# Patient Record
Sex: Female | Born: 1950
Health system: Southern US, Community
[De-identification: ages and names within clinical notes are randomized; demographics above are authoritative.]

## PROBLEM LIST (undated history)

## (undated) DIAGNOSIS — I10 Essential (primary) hypertension: Secondary | ICD-10-CM

## (undated) DIAGNOSIS — E785 Hyperlipidemia, unspecified: Secondary | ICD-10-CM

## (undated) DIAGNOSIS — R7303 Prediabetes: Secondary | ICD-10-CM

## (undated) DIAGNOSIS — F419 Anxiety disorder, unspecified: Secondary | ICD-10-CM

## (undated) HISTORY — PX: SKIN CANCER EXCISION: SHX779

## (undated) HISTORY — DX: Hyperlipidemia, unspecified: E78.5

## (undated) HISTORY — PX: ABDOMINAL HYSTERECTOMY: SHX81

## (undated) HISTORY — DX: Anxiety disorder, unspecified: F41.9

## (undated) HISTORY — DX: Essential (primary) hypertension: I10

---

## 1982-04-02 HISTORY — PX: OTHER SURGICAL HISTORY: SHX169

## 1992-04-02 HISTORY — PX: BREAST CYST ASPIRATION: SHX578

## 2004-02-09 ENCOUNTER — Ambulatory Visit: Payer: Self-pay | Admitting: Internal Medicine

## 2006-05-21 ENCOUNTER — Ambulatory Visit: Payer: Self-pay | Admitting: Internal Medicine

## 2007-07-25 ENCOUNTER — Ambulatory Visit: Payer: Self-pay | Admitting: Internal Medicine

## 2010-10-19 ENCOUNTER — Ambulatory Visit: Payer: Self-pay | Admitting: Internal Medicine

## 2011-10-23 ENCOUNTER — Ambulatory Visit: Payer: Self-pay | Admitting: Internal Medicine

## 2014-12-01 ENCOUNTER — Encounter: Payer: Self-pay | Admitting: *Deleted

## 2014-12-13 ENCOUNTER — Ambulatory Visit: Payer: Self-pay | Admitting: General Surgery

## 2014-12-20 ENCOUNTER — Ambulatory Visit (INDEPENDENT_AMBULATORY_CARE_PROVIDER_SITE_OTHER): Payer: Medicare HMO | Admitting: General Surgery

## 2014-12-20 ENCOUNTER — Encounter: Payer: Self-pay | Admitting: General Surgery

## 2014-12-20 VITALS — BP 130/72 | HR 70 | Resp 14 | Ht 61.0 in | Wt 167.0 lb

## 2014-12-20 DIAGNOSIS — Z1211 Encounter for screening for malignant neoplasm of colon: Secondary | ICD-10-CM | POA: Diagnosis not present

## 2014-12-20 MED ORDER — POLYETHYLENE GLYCOL 3350 17 GM/SCOOP PO POWD: ORAL | Status: DC

## 2014-12-20 NOTE — Progress Notes (Signed)
Patient ID: Heidi Bell, female   DOB: Jul 17, 1950, 64 y.o.   MRN: [id number]  Chief Complaint  Patient presents with  . Other    colonoscopy    HPI RAELENE TREW is a 65 y.o. female here today for a evaluation of an colonoscopy . Patient states no GI problems at this time. No prior colonoscopy.  HPI  Past Medical History  Diagnosis Date  . Hypertension   . Hyperlipidemia     Past Surgical History  Procedure Laterality Date  . Abdominal hysterectomy    . Boil removed  1984  . Breast cyst aspiration Left 1994    Dr. Bary Castilla    Family History  Problem Relation Age of Onset  . Cancer Father     colon  . COPD Mother   . Breast cancer Sister   . Lung cancer Mother     Social History Social History  Substance Use Topics  . Smoking status: Never Smoker   . Smokeless tobacco: None  . Alcohol Use: No    Allergies  Allergen Reactions  . Penicillins Hives    Current Outpatient Prescriptions  Medication Sig Dispense Refill  . Ascorbic Acid (VITAMIN C) 500 MG CAPS     . atenolol (TENORMIN) 50 MG tablet Take 1 tablet by mouth daily.    . Cholecalciferol (VITAMIN D3) 1000 UNITS CAPS Take by mouth daily.    . Garlic 2423 MG CAPS Take by mouth daily.    Marland Kitchen glucosamine-chondroitin 500-400 MG tablet Take 1 tablet by mouth 3 (three) times daily.    . hydrochlorothiazide (HYDRODIURIL) 25 MG tablet Take 25 mg by mouth daily.     . Multiple Vitamins-Minerals (MULTIVITAMIN WITH MINERALS) tablet Take 1 tablet by mouth daily.    . Omega-3 Fatty Acids (FISH OIL) 1200 MG CAPS Take by mouth daily.    . potassium chloride (K-DUR) 10 MEQ tablet Take 10 mEq by mouth 2 (two) times daily.     . Red Yeast Rice 600 MG CAPS Take by mouth.    . sertraline (ZOLOFT) 50 MG tablet Take 50 mg by mouth daily.     . polyethylene glycol powder (GLYCOLAX/MIRALAX) powder 255 grams one bottle for colonoscopy prep 255 g 0   No current facility-administered medications for this visit.    Review of  Systems Review of Systems  Constitutional: Negative.   Respiratory: Negative.   Cardiovascular: Negative.   Gastrointestinal: Negative.     Blood pressure 130/72, pulse 70, resp. rate 14, height 5\' 1"  (1.549 m), weight 167 lb (75.751 kg).  Physical Exam Physical Exam  Constitutional: She is oriented to person, place, and time. She appears well-developed and well-nourished.  Eyes: Conjunctivae are normal. No scleral icterus.  Neck: Neck supple.  Cardiovascular: Normal rate, regular rhythm and normal heart sounds.   Pulmonary/Chest: Effort normal and breath sounds normal.  Abdominal: Soft. Bowel sounds are normal. There is no tenderness.  Lymphadenopathy:    She has no cervical adenopathy.  Neurological: She is alert and oriented to person, place, and time.  Skin: Skin is warm and dry.    Data Reviewed Notes and labs reviewed  Assessment    Normal exam. Pt agreeable to screening colonoscopy   Plan    Colonoscopy with possible biopsy/polypectomy prn: Information regarding the procedure, including its potential risks and complications (including but not limited to perforation of the bowel, which may require emergency surgery to repair, and bleeding) was verbally given to the patient. Educational information regarding  lower intestinal endoscopy was given to the patient. Written instructions for how to complete the bowel prep using Miralax were provided. The importance of drinking ample fluids to avoid dehydration as a result of the prep emphasized.    Patient has been scheduled for a colonoscopy on 01-12-15 at [facility]. This patient has been asked to discontinue fish oil one week prior to procedure.   PCP:  Trudie Reed 12/22/2014, 8:23 AM

## 2014-12-20 NOTE — Patient Instructions (Addendum)
Colonoscopy A colonoscopy is an exam to look at the entire large intestine (colon). This exam can help find problems such as tumors, polyps, inflammation, and areas of bleeding. The exam takes about 1 hour.  LET [facility] CARE PROVIDER KNOW ABOUT:   Any allergies you have.  All medicines you are taking, including vitamins, herbs, eye drops, creams, and over-the-counter medicines.  Previous problems you or members of your family have had with the use of anesthetics.  Any blood disorders you have.  Previous surgeries you have had.  Medical conditions you have. RISKS AND COMPLICATIONS  Generally, this is a safe procedure. However, as with any procedure, complications can occur. Possible complications include:  Bleeding.  Tearing or rupture of the colon wall.  Reaction to medicines given during the exam.  Infection (rare). BEFORE THE PROCEDURE   Ask your health care provider about changing or stopping your regular medicines.  You may be prescribed an oral bowel prep. This involves drinking a large amount of medicated liquid, starting the day before your procedure. The liquid will cause you to have multiple loose stools until your stool is almost clear or light green. This cleans out your colon in preparation for the procedure.  Do not eat or drink anything else once you have started the bowel prep, unless your health care provider tells you it is safe to do so.  Arrange for someone to drive you home after the procedure. PROCEDURE   You will be given medicine to help you relax (sedative).  You will lie on your side with your knees bent.  A long, flexible tube with a light and camera on the end (colonoscope) will be inserted through the rectum and into the colon. The camera sends video back to a computer screen as it moves through the colon. The colonoscope also releases carbon dioxide gas to inflate the colon. This helps your health care provider see the area better.  During  the exam, your health care provider may take a small tissue sample (biopsy) to be examined under a microscope if any abnormalities are found.  The exam is finished when the entire colon has been viewed. AFTER THE PROCEDURE   Do not drive for 24 hours after the exam.  You may have a small amount of blood in your stool.  You may pass moderate amounts of gas and have mild abdominal cramping or bloating. This is caused by the gas used to inflate your colon during the exam.  Ask when your test results will be ready and how you will get your results. Make sure you get your test results. Document Released: 03/16/2000 Document Revised: 01/07/2013 Document Reviewed: 11/24/2012 [facility] Patient Information 2015 [place], [place]. This information is not intended to replace advice given to you by your health care provider. Make sure you discuss any questions you have with your health care provider.  Patient has been scheduled for a colonoscopy on 01-12-15 at [facility]. This patient has been asked to discontinue fish oil one week prior to procedure.

## 2014-12-22 ENCOUNTER — Encounter: Payer: Self-pay | Admitting: General Surgery

## 2015-01-11 ENCOUNTER — Encounter: Payer: Self-pay | Admitting: *Deleted

## 2015-01-11 ENCOUNTER — Other Ambulatory Visit: Payer: Self-pay | Admitting: General Surgery

## 2015-01-12 ENCOUNTER — Encounter
Admission: RE | Disposition: A | Discharge status: Home / Self Care | Payer: Self-pay | Source: Ambulatory Visit | Attending: General Surgery

## 2015-01-12 ENCOUNTER — Ambulatory Visit
Admission: RE | Admit: 2015-01-12 | Discharge: 2015-01-12 | Disposition: A | Discharge status: Home / Self Care | Payer: Medicare HMO | Source: Ambulatory Visit | Attending: General Surgery | Admitting: General Surgery

## 2015-01-12 ENCOUNTER — Ambulatory Visit: Payer: Medicare HMO | Admitting: Anesthesiology

## 2015-01-12 ENCOUNTER — Encounter: Payer: Self-pay | Admitting: *Deleted

## 2015-01-12 DIAGNOSIS — I10 Essential (primary) hypertension: Secondary | ICD-10-CM | POA: Insufficient documentation

## 2015-01-12 DIAGNOSIS — Z79899 Other long term (current) drug therapy: Secondary | ICD-10-CM | POA: Diagnosis not present

## 2015-01-12 DIAGNOSIS — D123 Benign neoplasm of transverse colon: Secondary | ICD-10-CM | POA: Insufficient documentation

## 2015-01-12 DIAGNOSIS — Z1211 Encounter for screening for malignant neoplasm of colon: Secondary | ICD-10-CM | POA: Insufficient documentation

## 2015-01-12 DIAGNOSIS — K5730 Diverticulosis of large intestine without perforation or abscess without bleeding: Secondary | ICD-10-CM | POA: Insufficient documentation

## 2015-01-12 DIAGNOSIS — Z803 Family history of malignant neoplasm of breast: Secondary | ICD-10-CM | POA: Insufficient documentation

## 2015-01-12 DIAGNOSIS — Z800 Family history of malignant neoplasm of digestive organs: Secondary | ICD-10-CM | POA: Insufficient documentation

## 2015-01-12 DIAGNOSIS — Z801 Family history of malignant neoplasm of trachea, bronchus and lung: Secondary | ICD-10-CM | POA: Insufficient documentation

## 2015-01-12 DIAGNOSIS — Z880 Allergy status to penicillin: Secondary | ICD-10-CM | POA: Insufficient documentation

## 2015-01-12 DIAGNOSIS — Z90710 Acquired absence of both cervix and uterus: Secondary | ICD-10-CM | POA: Diagnosis not present

## 2015-01-12 DIAGNOSIS — D120 Benign neoplasm of cecum: Secondary | ICD-10-CM | POA: Insufficient documentation

## 2015-01-12 DIAGNOSIS — E785 Hyperlipidemia, unspecified: Secondary | ICD-10-CM | POA: Insufficient documentation

## 2015-01-12 DIAGNOSIS — Z825 Family history of asthma and other chronic lower respiratory diseases: Secondary | ICD-10-CM | POA: Diagnosis not present

## 2015-01-12 HISTORY — PX: COLONOSCOPY WITH PROPOFOL: SHX5780

## 2015-01-12 SURGERY — COLONOSCOPY WITH PROPOFOL
Anesthesia: General

## 2015-01-12 MED ORDER — PROPOFOL 500 MG/50ML IV EMUL
INTRAVENOUS | Status: DC | PRN
  Administered 2015-01-12: 125 ug/kg/min via INTRAVENOUS

## 2015-01-12 MED ORDER — SODIUM CHLORIDE 0.9 % IV SOLN
INTRAVENOUS | Status: DC
  Administered 2015-01-12: 08:00:00 via INTRAVENOUS

## 2015-01-12 MED ORDER — PROPOFOL 10 MG/ML IV BOLUS
INTRAVENOUS | Status: DC | PRN
  Administered 2015-01-12: 40 mg via INTRAVENOUS
  Administered 2015-01-12: 60 mg via INTRAVENOUS

## 2015-01-12 MED ORDER — LIDOCAINE HCL (CARDIAC) 20 MG/ML IV SOLN
INTRAVENOUS | Status: DC | PRN
  Administered 2015-01-12: 60 mg via INTRAVENOUS

## 2015-01-12 NOTE — H&P (View-Only) (Signed)
Patient ID: Heidi Bell, female   DOB: 17-Apr-1950, 64 y.o.   MRN: [id number]  Chief Complaint  Patient presents with  . Other    colonoscopy    HPI Heidi Bell is a 64 y.o. female here today for a evaluation of an colonoscopy . Patient states no GI problems at this time. No prior colonoscopy.  HPI  Past Medical History  Diagnosis Date  . Hypertension   . Hyperlipidemia     Past Surgical History  Procedure Laterality Date  . Abdominal hysterectomy    . Boil removed  1984  . Breast cyst aspiration Left 1994    Dr. Bary Castilla    Family History  Problem Relation Age of Onset  . Cancer Father     colon  . COPD Mother   . Breast cancer Sister   . Lung cancer Mother     Social History Social History  Substance Use Topics  . Smoking status: Never Smoker   . Smokeless tobacco: None  . Alcohol Use: No    Allergies  Allergen Reactions  . Penicillins Hives    Current Outpatient Prescriptions  Medication Sig Dispense Refill  . Ascorbic Acid (VITAMIN C) 500 MG CAPS     . atenolol (TENORMIN) 50 MG tablet Take 1 tablet by mouth daily.    . Cholecalciferol (VITAMIN D3) 1000 UNITS CAPS Take by mouth daily.    . Garlic 0865 MG CAPS Take by mouth daily.    Marland Kitchen glucosamine-chondroitin 500-400 MG tablet Take 1 tablet by mouth 3 (three) times daily.    . hydrochlorothiazide (HYDRODIURIL) 25 MG tablet Take 25 mg by mouth daily.     . Multiple Vitamins-Minerals (MULTIVITAMIN WITH MINERALS) tablet Take 1 tablet by mouth daily.    . Omega-3 Fatty Acids (FISH OIL) 1200 MG CAPS Take by mouth daily.    . potassium chloride (K-DUR) 10 MEQ tablet Take 10 mEq by mouth 2 (two) times daily.     . Red Yeast Rice 600 MG CAPS Take by mouth.    . sertraline (ZOLOFT) 50 MG tablet Take 50 mg by mouth daily.     . polyethylene glycol powder (GLYCOLAX/MIRALAX) powder 255 grams one bottle for colonoscopy prep 255 g 0   No current facility-administered medications for this visit.    Review of  Systems Review of Systems  Constitutional: Negative.   Respiratory: Negative.   Cardiovascular: Negative.   Gastrointestinal: Negative.     Blood pressure 130/72, pulse 70, resp. rate 14, height 5\' 1"  (1.549 m), weight 167 lb (75.751 kg).  Physical Exam Physical Exam  Constitutional: She is oriented to person, place, and time. She appears well-developed and well-nourished.  Eyes: Conjunctivae are normal. No scleral icterus.  Neck: Neck supple.  Cardiovascular: Normal rate, regular rhythm and normal heart sounds.   Pulmonary/Chest: Effort normal and breath sounds normal.  Abdominal: Soft. Bowel sounds are normal. There is no tenderness.  Lymphadenopathy:    She has no cervical adenopathy.  Neurological: She is alert and oriented to person, place, and time.  Skin: Skin is warm and dry.    Data Reviewed Notes and labs reviewed  Assessment    Normal exam. Pt agreeable to screening colonoscopy   Plan    Colonoscopy with possible biopsy/polypectomy prn: Information regarding the procedure, including its potential risks and complications (including but not limited to perforation of the bowel, which may require emergency surgery to repair, and bleeding) was verbally given to the patient. Educational information regarding  lower intestinal endoscopy was given to the patient. Written instructions for how to complete the bowel prep using Miralax were provided. The importance of drinking ample fluids to avoid dehydration as a result of the prep emphasized.    Patient has been scheduled for a colonoscopy on 01-12-15 at [facility]. This patient has been asked to discontinue fish oil one week prior to procedure.   PCP:  Trudie Reed 12/22/2014, 8:23 AM

## 2015-01-12 NOTE — Anesthesia Preprocedure Evaluation (Signed)
Anesthesia Evaluation  Patient identified by MRN, date of birth, ID band Patient awake    Reviewed: Allergy & Precautions, H&P , NPO status , Patient's Chart, lab work & pertinent test results, reviewed documented beta blocker date and time   Airway Mallampati: II  TM Distance: >3 FB Neck ROM: full    Dental no notable dental hx. (+) Teeth Intact   Pulmonary neg pulmonary ROS,    Pulmonary exam normal breath sounds clear to auscultation       Cardiovascular Exercise Tolerance: Good hypertension, negative cardio ROS Normal cardiovascular exam Rhythm:regular Rate:Normal     Neuro/Psych negative neurological ROS  negative psych ROS   GI/Hepatic negative GI ROS, Neg liver ROS,   Endo/Other  negative endocrine ROS  Renal/GU negative Renal ROS  negative genitourinary   Musculoskeletal   Abdominal   Peds  Hematology negative hematology ROS (+)   Anesthesia Other Findings   Reproductive/Obstetrics negative OB ROS                             Anesthesia Physical Anesthesia Plan  ASA: II  Anesthesia Plan: General   Post-op Pain Management:    Induction:   Airway Management Planned:   Additional Equipment:   Intra-op Plan:   Post-operative Plan:   Informed Consent: I have reviewed the patients History and Physical, chart, labs and discussed the procedure including the risks, benefits and alternatives for the proposed anesthesia with the patient or authorized representative who has indicated his/her understanding and acceptance.   Dental Advisory Given  Plan Discussed with: CRNA  Anesthesia Plan Comments:         Anesthesia Quick Evaluation

## 2015-01-12 NOTE — Interval H&P Note (Signed)
History and Physical Interval Note:  01/12/2015 7:52 AM  Heidi Bell  has presented today for surgery, with the diagnosis of SCREENING  The various methods of treatment have been discussed with the patient and family. After consideration of risks, benefits and other options for treatment, the patient has consented to  Procedure(s): COLONOSCOPY WITH PROPOFOL (N/A) as a surgical intervention .  The patient's history has been reviewed, patient examined, no change in status, stable for surgery.  I have reviewed the patient's chart and labs.  Questions were answered to the patient's satisfaction.     SANKAR,SEEPLAPUTHUR G

## 2015-01-12 NOTE — Transfer of Care (Signed)
Immediate Anesthesia Transfer of Care Note  Patient: Heidi Bell  Procedure(s) Performed: Procedure(s): COLONOSCOPY WITH PROPOFOL (N/A)  Patient Location: PACU  Anesthesia Type:General  Level of Consciousness: awake, alert  and oriented  Airway & Oxygen Therapy: Patient Spontanous Breathing and Patient connected to nasal cannula oxygen  Post-op Assessment: Report given to RN and Post -op Vital signs reviewed and stable  Post vital signs: stable  Last Vitals:  Filed Vitals:   01/12/15 0847  BP: 117/60  Pulse: 63  Temp: 36.1 C  Resp: 17    Complications: No apparent anesthesia complications

## 2015-01-12 NOTE — Op Note (Signed)
[facility] Gastroenterology Patient Name: Heidi Bell Procedure Date: 01/12/2015 8:07 AM MRN: [id number] Account #: [id number] Date of Birth: 1950/11/26 Admit Type: Outpatient Age: 64 Room: [facility] ENDO ROOM 1 Gender: Female Note Status: Finalized Procedure:         Colonoscopy Indications:       Screening for colorectal malignant neoplasm Providers:         Orlie Pollen, MD Referring MD:      Leona Carry. Hall Busing, MD (Referring MD) Medicines:         General Anesthesia Complications:     No immediate complications. Procedure:         Pre-Anesthesia Assessment:                    - General anesthesia under the supervision of an                     anesthesiologist was determined to be medically necessary                     for this procedure based on review of the patient's                     medical history, medications, and prior anesthesia history.                    After obtaining informed consent, the colonoscope was                     passed under direct vision. Throughout the procedure, the                     patient's blood pressure, pulse, and oxygen saturations                     were monitored continuously. The Colonoscope was                     introduced through the anus and advanced to the the cecum,                     identified by the ileocecal valve. The colonoscopy was                     performed without difficulty. The patient tolerated the                     procedure well. The quality of the bowel preparation was                     excellent. Findings:      The perianal and digital rectal examinations were normal.      A few small and large-mouthed diverticula were found in the sigmoid       colon.      A 5 mm polyp was found in the transverse colon. The polyp was sessile.       The polyp was removed with a hot biopsy forceps. Resection and retrieval       were complete.      A 5 mm polyp was found in the cecum. The polyp was  sessile. The polyp       was removed with a hot snare. Resection and retrieval were complete.       Estimated blood loss was minimal.  The exam was otherwise without abnormality on direct and retroflexion       views. Impression:        - Diverticulosis in the sigmoid colon.                    - One 5 mm polyp in the transverse colon. Resected and                     retrieved.                    - One 5 mm polyp in the cecum. Resected and retrieved.                    - The examination was otherwise normal on direct and                     retroflexion views. Recommendation:    - Use fiber, for example Citrucel, Fibercon, Konsyl or                     Metamucil.                    - Repeat colonoscopy in 5 years for surveillance. Procedure Code(s): --- Professional ---                    [phone], Colonoscopy, flexible; with removal of tumor(s),                     polyp(s), or other lesion(s) by snare technique                    45384, 59, Colonoscopy, flexible; with removal of                     tumor(s), polyp(s), or other lesion(s) by hot biopsy                     forceps Diagnosis Code(s): --- Professional ---                    Z12.11, Encounter for screening for malignant neoplasm of                     colon                    D12.3, Benign neoplasm of transverse colon                    D12.0, Benign neoplasm of cecum                    K57.30, Diverticulosis of large intestine without                     perforation or abscess without bleeding CPT copyright 2014 American Medical Association. All rights reserved. The codes documented in this report are preliminary and upon coder review may  be revised to meet current compliance requirements. Orlie Pollen, MD 01/12/2015 8:47:18 AM This report has been signed electronically. Number of Addenda: 0 Note Initiated On: 01/12/2015 8:07 AM Scope Withdrawal Time: 0 hours 9 minutes 56 seconds  Total Procedure Duration:  0 hours 23 minutes 52 seconds       [facility]

## 2015-01-13 LAB — SURGICAL PATHOLOGY

## 2015-01-13 NOTE — Anesthesia Postprocedure Evaluation (Signed)
  Anesthesia Post-op Note  Patient: Heidi Bell  Procedure(s) Performed: Procedure(s): COLONOSCOPY WITH PROPOFOL (N/A)  Anesthesia type:General  Patient location: PACU  Post pain: Pain level controlled  Post assessment: Post-op Vital signs reviewed, Patient's Cardiovascular Status Stable, Respiratory Function Stable, Patent Airway and No signs of Nausea or vomiting  Post vital signs: Reviewed and stable  Last Vitals:  Filed Vitals:   01/12/15 0910  BP: 155/92  Pulse: 73  Temp:   Resp: 19    Level of consciousness: awake, alert  and patient cooperative  Complications: No apparent anesthesia complications

## 2015-01-18 ENCOUNTER — Telehealth: Payer: Self-pay | Admitting: *Deleted

## 2015-01-18 NOTE — Telephone Encounter (Signed)
-----   Message from Christene Lye, MD sent at 01/13/2015  6:07 PM EDT ----- Please let pt pt know the pathology was normal. Needs colonoscopy in 5 yrs. Copy to PCP

## 2015-01-18 NOTE — Telephone Encounter (Signed)
Notified patient as instructed, patient pleased. Discussed follow-up appointments, patient agrees. Placed in recalls.  

## 2015-01-19 ENCOUNTER — Encounter: Payer: Self-pay | Admitting: General Surgery

## 2015-06-01 DIAGNOSIS — I10 Essential (primary) hypertension: Secondary | ICD-10-CM | POA: Diagnosis not present

## 2015-06-01 DIAGNOSIS — E785 Hyperlipidemia, unspecified: Secondary | ICD-10-CM | POA: Diagnosis not present

## 2015-06-08 DIAGNOSIS — E785 Hyperlipidemia, unspecified: Secondary | ICD-10-CM | POA: Diagnosis not present

## 2015-06-08 DIAGNOSIS — F330 Major depressive disorder, recurrent, mild: Secondary | ICD-10-CM | POA: Diagnosis not present

## 2015-06-08 DIAGNOSIS — I10 Essential (primary) hypertension: Secondary | ICD-10-CM | POA: Diagnosis not present

## 2015-11-30 DIAGNOSIS — I10 Essential (primary) hypertension: Secondary | ICD-10-CM | POA: Diagnosis not present

## 2015-12-09 DIAGNOSIS — I10 Essential (primary) hypertension: Secondary | ICD-10-CM | POA: Diagnosis not present

## 2015-12-09 DIAGNOSIS — E785 Hyperlipidemia, unspecified: Secondary | ICD-10-CM | POA: Diagnosis not present

## 2015-12-23 DIAGNOSIS — I10 Essential (primary) hypertension: Secondary | ICD-10-CM | POA: Diagnosis not present

## 2015-12-23 DIAGNOSIS — E785 Hyperlipidemia, unspecified: Secondary | ICD-10-CM | POA: Diagnosis not present

## 2016-01-23 DIAGNOSIS — L719 Rosacea, unspecified: Secondary | ICD-10-CM | POA: Diagnosis not present

## 2016-02-21 DIAGNOSIS — I10 Essential (primary) hypertension: Secondary | ICD-10-CM | POA: Diagnosis not present

## 2016-03-29 DIAGNOSIS — I10 Essential (primary) hypertension: Secondary | ICD-10-CM | POA: Diagnosis not present

## 2016-06-22 DIAGNOSIS — I10 Essential (primary) hypertension: Secondary | ICD-10-CM | POA: Diagnosis not present

## 2016-07-16 DIAGNOSIS — E785 Hyperlipidemia, unspecified: Secondary | ICD-10-CM | POA: Diagnosis not present

## 2016-07-16 DIAGNOSIS — I10 Essential (primary) hypertension: Secondary | ICD-10-CM | POA: Diagnosis not present

## 2016-09-18 DIAGNOSIS — E785 Hyperlipidemia, unspecified: Secondary | ICD-10-CM | POA: Diagnosis not present

## 2016-09-18 DIAGNOSIS — I10 Essential (primary) hypertension: Secondary | ICD-10-CM | POA: Diagnosis not present

## 2016-12-07 DIAGNOSIS — E785 Hyperlipidemia, unspecified: Secondary | ICD-10-CM | POA: Diagnosis not present

## 2016-12-07 DIAGNOSIS — I10 Essential (primary) hypertension: Secondary | ICD-10-CM | POA: Diagnosis not present

## 2016-12-13 DIAGNOSIS — I10 Essential (primary) hypertension: Secondary | ICD-10-CM | POA: Diagnosis not present

## 2016-12-13 DIAGNOSIS — E785 Hyperlipidemia, unspecified: Secondary | ICD-10-CM | POA: Diagnosis not present

## 2017-02-26 ENCOUNTER — Ambulatory Visit (INDEPENDENT_AMBULATORY_CARE_PROVIDER_SITE_OTHER): Payer: PPO | Admitting: Nurse Practitioner

## 2017-02-26 ENCOUNTER — Other Ambulatory Visit: Payer: Self-pay

## 2017-02-26 ENCOUNTER — Encounter: Payer: Self-pay | Admitting: Nurse Practitioner

## 2017-02-26 ENCOUNTER — Other Ambulatory Visit: Payer: Self-pay | Admitting: Nurse Practitioner

## 2017-02-26 VITALS — BP 150/56 | HR 55 | Temp 98.0°F | Ht 61.0 in | Wt 146.8 lb

## 2017-02-26 DIAGNOSIS — I10 Essential (primary) hypertension: Secondary | ICD-10-CM | POA: Diagnosis not present

## 2017-02-26 DIAGNOSIS — Z7689 Persons encountering health services in other specified circumstances: Secondary | ICD-10-CM | POA: Diagnosis not present

## 2017-02-26 DIAGNOSIS — E782 Mixed hyperlipidemia: Secondary | ICD-10-CM | POA: Diagnosis not present

## 2017-02-26 DIAGNOSIS — F419 Anxiety disorder, unspecified: Secondary | ICD-10-CM

## 2017-02-26 DIAGNOSIS — E785 Hyperlipidemia, unspecified: Secondary | ICD-10-CM | POA: Insufficient documentation

## 2017-02-26 MED ORDER — POTASSIUM CHLORIDE CRYS ER 10 MEQ PO TBCR: 10.0000 meq | EXTENDED_RELEASE_TABLET | Freq: Every day | ORAL | 1 refills | Status: DC

## 2017-02-26 MED ORDER — VITAMIN C 500 MG PO CAPS: 500.0000 mg | ORAL_CAPSULE | Freq: Every day | ORAL | Status: AC

## 2017-02-26 MED ORDER — RED YEAST RICE 600 MG PO CAPS: 1800.0000 mg | ORAL_CAPSULE | Freq: Every day | ORAL | Status: DC

## 2017-02-26 MED ORDER — FISH OIL 1200 MG PO CAPS: 1200.0000 mg | ORAL_CAPSULE | Freq: Two times a day (BID) | ORAL | Status: DC

## 2017-02-26 MED ORDER — SERTRALINE HCL 25 MG PO TABS: 25.0000 mg | ORAL_TABLET | Freq: Every day | ORAL | 1 refills | Status: DC

## 2017-02-26 MED ORDER — GLUCOSAMINE-CHONDROITIN 500-400 MG PO TABS: 1.0000 | ORAL_TABLET | Freq: Two times a day (BID) | ORAL | Status: AC

## 2017-02-26 NOTE — Assessment & Plan Note (Signed)
Uncontrolled hypertension in clinic today.  Pt reports home BP readings that are consistently at goal.  Pt is working on lifestyle modifications.  Taking medications tolerating well without side effects. No current complications.  Plan: 1. Continue taking atenolol, losartan, hydrochlorothiazide. 2. Obtain labs CMP and lipid panel prior to next visit.  3. Encouraged heart healthy diet and increasing exercise to 30 minutes most days of the week. 4. Check BP 1-2 x per week at home, keep log, and bring to clinic at next appointment. 5. Follow up 3-4 months.

## 2017-02-26 NOTE — Assessment & Plan Note (Signed)
Controlled on last lab checked by prior PCP.  Pt is trying to watch and limit fat in foods.  Not currently on a statin, but is taking red yeast rice 1800 mg daily and 2400 mg daily of fish oil.  Plan: 1. Continue current supplement treatment without changes. 2. Recheck lipid panel fasting prior to next appointment in 3 months. Consider changing therapy if not at goal levels. 3. Followup 3 months.

## 2017-02-26 NOTE — Patient Instructions (Addendum)
Heidi Bell, Thank you for coming in to clinic today.  1. For your blood pressure: - It is a bit high today, but this may be from being nervous about your new appointment. - Keep checking at home.  Your goal is less than 130/80.   - Keep a log of your readings. - RECHECK BP in clinic in about 6 weeks.  Bring your machine with you so we can compare.   2. For cholesterol: -  Continue your current supplements.  - Continue watching your fat in your diet.  Please schedule a follow-up appointment with [name], AGNP. Return for blood pressure and cholesterol with labs 2-3 days prior AND blood pressure check w/ CMA in 6 weeks.  If you have any other questions or concerns, please feel free to call the clinic or send a message through [facility]. You may also schedule an earlier appointment if necessary.  You will receive a survey after today's visit either digitally by e-mail or paper by [employer]. Your experiences and feedback matter to [place].  Please respond so we know how we are doing as we provide care for you.   [name], DNP, AGNP-BC Adult Gerontology Nurse Practitioner [place]

## 2017-02-26 NOTE — Assessment & Plan Note (Signed)
Currently controlled. Pt not currently on medication, but is showing rapid pressured speech.  Admits this is her normal and is comfortable w/ current symptoms. Not taking sertraline daily.  Plan: 1. Encouraged pt to take sertraline 25 mg once daily for best effect needs 6-8 weeks consistent dosing. 2. Followup as needed and at least once annually.

## 2017-02-26 NOTE — Progress Notes (Signed)
Subjective:    Patient ID: Heidi Bell, female    DOB: 04/01/1951, 66 y.o.   MRN: [id number]  Heidi Bell is a 66 y.o. female presenting on 02/26/2017 for [place] (hypertension )   HPI Establish Care New Provider Pt last seen by PCP Dr. Hall Busing 3 months ago.  Records received and reviewed today.    Hypertension  - She is checking BP at home or outside of clinic.  Readings 133/60 today. - Current medications: losartan 100 mg once daily, hydrocholorothiazide 25 mg once daily, atenolol 50 mg once daily , tolerating well without side effects - She is not currently symptomatic. - Pt denies headache, lightheadedness, dizziness, changes in vision, chest tightness/pressure, palpitations, leg swelling, sudden loss of speech or loss of consciousness. - She  reports an exercise routine that includes walking at the rec, 4-5 days per week. - Her diet is low in salt, low in fat, and moderate in carbohydrates.  Depression and Anxiety Takes only for a couple of days when she had "moody crying."  None are severe symptoms, but has some bad days mixed in w/ mostly good days.  HEALTH MAINTEANCE: [place] and pap. Declines pneumonia vaccine. Flu vaccine administered 2018 at [facility] for volunteer.  Past Medical History:  Diagnosis Date  . Anxiety   . Hyperlipidemia   . Hypertension    Past Surgical History:  Procedure Laterality Date  . ABDOMINAL HYSTERECTOMY    . boil removed  1984  . BREAST CYST ASPIRATION Left 1994   Dr. Bary Castilla  . COLONOSCOPY WITH PROPOFOL N/A 01/12/2015   Procedure: COLONOSCOPY WITH PROPOFOL;  Surgeon: Christene Lye, MD;  Location: [facility] ENDOSCOPY;  Service: Endoscopy;  Laterality: N/A;   Social History   Socioeconomic History  . Marital status: Single    Spouse name: Not on file  . Number of children: Not on file  . Years of education: Not on file  . Highest education level: Not on file  Social Needs  . Financial resource strain:  Not on file  . Food insecurity - worry: Not on file  . Food insecurity - inability: Not on file  . Transportation needs - medical: Not on file  . Transportation needs - non-medical: Not on file  Occupational History  . Not on file  Tobacco Use  . Smoking status: Never Smoker  . Smokeless tobacco: Never Used  Substance and Sexual Activity  . Alcohol use: No    Alcohol/week: 0.0 oz  . Drug use: No  . Sexual activity: Not on file  Other Topics Concern  . Not on file  Social History Narrative  . Not on file   Family History  Problem Relation Age of Onset  . Colon cancer Father   . COPD Mother   . Lung cancer Mother   . Bleeding Disorder Mother   . Breast cancer Sister    Current Outpatient Medications on File Prior to Visit  Medication Sig  . atenolol (TENORMIN) 50 MG tablet Take 1 tablet by mouth daily.  . Cholecalciferol (VITAMIN D3) 1000 UNITS CAPS Take by mouth daily.  . Garlic 6295 MG CAPS Take by mouth daily.  . hydrochlorothiazide (HYDRODIURIL) 25 MG tablet Take 25 mg by mouth daily.   Mar[place] Kitchen losartan (COZAAR) 100 MG tablet TK 1 T PO  QD  . Multiple Vitamins-Minerals (MULTIVITAMIN WITH MINERALS) tablet Take 1 tablet by mouth daily.   No current facility-administered medications on file prior to visit.  Review of Systems  Constitutional: Negative.   HENT: Negative.   Eyes: Negative.   Respiratory: Negative.   Cardiovascular: Negative.   Gastrointestinal: Negative.   Endocrine: Negative.   Genitourinary: Negative.   Musculoskeletal: Positive for arthralgias (hands, r leg).  Skin: Negative.   Allergic/Immunologic: Positive for environmental allergies.  Neurological: Negative.   Hematological: Negative.   Psychiatric/Behavioral: The patient is nervous/anxious.    Per HPI unless specifically indicated above     Objective:    BP (!) 150/56 (BP Location: Left Arm, Patient Position: Sitting, Cuff Size: Normal)   Pulse (!) 55   Temp 98 F (36.7 C) (Oral)   Ht  5\' 1"  (1.549 m)   Wt 146 lb 12.8 oz (66.6 kg)   BMI 27.74 kg/m   BP recheck: 152/60  Wt Readings from Last 3 Encounters:  02/26/17 146 lb 12.8 oz (66.6 kg)  01/12/15 157 lb (71.2 kg)  12/20/14 167 lb (75.8 kg)    Physical Exam General - overweight, well-appearing, NAD HEENT - Normocephalic, atraumatic, external ear/TM/ear canal normal, oropharynx clear and moist,  PEERL  Neck - supple, non-tender, no LAD, no thyromegaly, no carotid bruit Heart - RRR, no murmurs heard Lungs - Clear throughout all lobes, no wheezing, crackles, or rhonchi. Normal work of breathing. Extremeties - non-tender, no edema, cap refill < 2 seconds, peripheral pulses intact +2 bilaterally Skin - warm, dry Neuro - awake, alert, oriented x3, normal gait Psych - Normal mood and affect, normal behavior      Assessment & Plan:   Problem List Items Addressed This Visit      Cardiovascular and Mediastinum   Hypertension    Uncontrolled hypertension in clinic today.  Pt reports home BP readings that are consistently at goal.  Pt is working on lifestyle modifications.  Taking medications tolerating well without side effects. No current complications.  Plan: 1. Continue taking atenolol, losartan, hydrochlorothiazide. 2. Obtain labs CMP and lipid panel prior to next visit.  3. Encouraged heart healthy diet and increasing exercise to 30 minutes most days of the week. 4. Check BP 1-2 x per week at home, keep log, and bring to clinic at next appointment. 5. Follow up 3-4 months.        Relevant Medications   losartan (COZAAR) 100 MG tablet     Other   Anxiety    Currently controlled. Pt not currently on medication, but is showing rapid pressured speech.  Admits this is her normal and is comfortable w/ current symptoms. Not taking sertraline daily.  Plan: 1. Encouraged pt to take sertraline 25 mg once daily for best effect needs 6-8 weeks consistent dosing. 2. Followup as needed and at least once annually.       Relevant Medications   sertraline (ZOLOFT) 25 MG tablet   Hyperlipidemia    Controlled on last lab checked by prior PCP.  Pt is trying to watch and limit fat in foods.  Not currently on a statin, but is taking red yeast rice 1800 mg daily and 2400 mg daily of fish oil.  Plan: 1. Continue current supplement treatment without changes. 2. Recheck lipid panel fasting prior to next appointment in 3 months. Consider changing therapy if not at goal levels. 3. Followup 3 months.      Relevant Medications   losartan (COZAAR) 100 MG tablet    Other Visit Diagnoses    Encounter to establish care    -  Primary   Pt w/ prior PCP Dr. Hall Busing.  Records already received and reviewed.  Medical history reviewed w/ pt today.      Meds ordered this encounter  Medications  . sertraline (ZOLOFT) 25 MG tablet    Sig: Take 1 tablet (25 mg total) by mouth daily.    Dispense:  90 tablet    Refill:  1    DO not fill until requested by patient.    Order Specific Question:   Supervising Provider    Answer:   Olin Hauser [[id number]]  . glucosamine-chondroitin 500-400 MG tablet    Sig: Take 1 tablet by mouth 2 (two) times daily.    Order Specific Question:   Supervising Provider    Answer:   Olin Hauser [[id number]]  . Omega-3 Fatty Acids (FISH OIL) 1200 MG CAPS    Sig: Take 1 capsule (1,200 mg total) by mouth 2 (two) times daily.    Order Specific Question:   Supervising Provider    Answer:   Olin Hauser [[id number]]  . Red Yeast Rice 600 MG CAPS    Sig: Take 3 capsules (1,800 mg total) by mouth daily.    Order Specific Question:   Supervising Provider    Answer:   Olin Hauser [[id number]]  . potassium chloride (K-DUR,KLOR-CON) 10 MEQ tablet    Sig: Take 1 tablet (10 mEq total) by mouth daily.    Dispense:  90 tablet    Refill:  1    Fill when pt requests    Order Specific Question:   Supervising Provider    Answer:   Olin Hauser [[id number]]  . Ascorbic Acid  (VITAMIN C) 500 MG CAPS    Sig: Take 500 mg by mouth daily.    Order Specific Question:   Supervising Provider    Answer:   Olin Hauser [[id number]]    Follow up plan: Return for blood pressure and cholesterol with labs 2-3 days prior AND blood pressure check w/ CMA in 6 weeks.  [name], DNP, AGPCNP-BC Adult Gerontology Primary Care Nurse Practitioner [facility] 02/26/2017, 12:55 PM

## 2017-03-01 ENCOUNTER — Other Ambulatory Visit: Payer: PPO

## 2017-03-01 DIAGNOSIS — E782 Mixed hyperlipidemia: Secondary | ICD-10-CM

## 2017-03-01 DIAGNOSIS — I10 Essential (primary) hypertension: Secondary | ICD-10-CM

## 2017-03-01 LAB — COMPREHENSIVE METABOLIC PANEL
AG Ratio: 1.7 (calc) (ref 1.0–2.5)
ALT: 14 U/L (ref 6–29)
AST: 20 U/L (ref 10–35)
Albumin: 4.5 g/dL (ref 3.6–5.1)
Alkaline phosphatase (APISO): 60 U/L (ref 33–130)
BUN: 22 mg/dL (ref 7–25)
CO2: 32 mmol/L (ref 20–32)
Calcium: 9.6 mg/dL (ref 8.6–10.4)
Chloride: 102 mmol/L (ref 98–110)
Creat: 0.64 mg/dL (ref 0.50–0.99)
Globulin: 2.6 g/dL (calc) (ref 1.9–3.7)
Glucose, Bld: 94 mg/dL (ref 65–99)
Potassium: 3.8 mmol/L (ref 3.5–5.3)
Sodium: 141 mmol/L (ref 135–146)
Total Bilirubin: 1 mg/dL (ref 0.2–1.2)
Total Protein: 7.1 g/dL (ref 6.1–8.1)

## 2017-03-01 LAB — LIPID PANEL
Cholesterol: 232 mg/dL — ABNORMAL HIGH (ref <200)
HDL: 67 mg/dL (ref >50)
LDL Cholesterol (Calc): 139 mg/dL (calc) — ABNORMAL HIGH
Non-HDL Cholesterol (Calc): 165 mg/dL (calc) — ABNORMAL HIGH (ref <130)
Total CHOL/HDL Ratio: 3.5 (calc) (ref <5.0)
Triglycerides: 133 mg/dL (ref <150)

## 2017-03-01 LAB — TSH: TSH: 1.69 mIU/L (ref 0.40–4.50)

## 2017-03-08 ENCOUNTER — Other Ambulatory Visit: Payer: Self-pay | Admitting: Nurse Practitioner

## 2017-03-19 ENCOUNTER — Telehealth: Payer: Self-pay | Admitting: Nurse Practitioner

## 2017-03-19 MED ORDER — LOSARTAN POTASSIUM 100 MG PO TABS: 100.0000 mg | ORAL_TABLET | Freq: Every day | ORAL | 1 refills | Status: DC

## 2017-03-19 NOTE — Telephone Encounter (Signed)
Pt needs refills on losartan sent to [facility] in [place].

## 2017-04-09 ENCOUNTER — Ambulatory Visit: Payer: PPO

## 2017-04-09 VITALS — BP 142/56 | HR 53

## 2017-04-09 DIAGNOSIS — I10 Essential (primary) hypertension: Secondary | ICD-10-CM

## 2017-04-09 NOTE — Progress Notes (Signed)
BP readings consistent from 1 reading to next with pt machine, however SBP is consistently approx 10 mmHg higher than manual reading. DBP is accurate. - Pt also has higher BP readings in clinic than at home, consistent with white coat hypertension.  For BP treatment, home BP readings will be very important.  Called pt to share information and requested she bring a log of BP readings to her appointments.

## 2017-04-09 NOTE — Progress Notes (Signed)
Pt came in today for blood pressure machine calibration. Blood pressure checked by  1.) Patient machine 150/60, Pulse 53 2.) #2 Bp Machine 144/48  3.) Manual BP 142/56, Pulse 50

## 2017-04-30 ENCOUNTER — Other Ambulatory Visit: Payer: Self-pay | Admitting: Nurse Practitioner

## 2017-05-22 ENCOUNTER — Other Ambulatory Visit: Payer: Self-pay | Admitting: Nurse Practitioner

## 2017-05-22 MED ORDER — HYDROCHLOROTHIAZIDE 25 MG PO TABS: 25.0000 mg | ORAL_TABLET | Freq: Every day | ORAL | 6 refills | Status: DC

## 2017-05-22 MED ORDER — SERTRALINE HCL 25 MG PO TABS: 25.0000 mg | ORAL_TABLET | Freq: Every day | ORAL | 1 refills | Status: DC

## 2017-05-22 NOTE — Telephone Encounter (Signed)
Pt asked about refills on sertraline and hydrochlorothiazide [phone]

## 2017-05-23 ENCOUNTER — Other Ambulatory Visit: Payer: Self-pay

## 2017-07-11 ENCOUNTER — Encounter: Payer: Self-pay | Admitting: Nurse Practitioner

## 2017-07-11 ENCOUNTER — Ambulatory Visit (INDEPENDENT_AMBULATORY_CARE_PROVIDER_SITE_OTHER): Payer: PPO | Admitting: Nurse Practitioner

## 2017-07-11 ENCOUNTER — Other Ambulatory Visit: Payer: Self-pay

## 2017-07-11 VITALS — BP 180/60 | HR 56 | Temp 98.2°F | Ht 61.0 in | Wt 147.0 lb

## 2017-07-11 DIAGNOSIS — Z7689 Persons encountering health services in other specified circumstances: Secondary | ICD-10-CM

## 2017-07-11 DIAGNOSIS — I10 Essential (primary) hypertension: Secondary | ICD-10-CM

## 2017-07-11 DIAGNOSIS — E785 Hyperlipidemia, unspecified: Secondary | ICD-10-CM

## 2017-07-11 DIAGNOSIS — F419 Anxiety disorder, unspecified: Secondary | ICD-10-CM

## 2017-07-11 MED ORDER — SERTRALINE HCL 50 MG PO TABS: 50.0000 mg | ORAL_TABLET | Freq: Every day | ORAL | 1 refills | Status: DC

## 2017-07-11 NOTE — Patient Instructions (Addendum)
Heidi Bell,   Thank you for coming in to clinic today.  1. INCREASE sertraline to 50 mg once daily.  For your current 25 mg tablet, take 2 tablets once daily.  When you get your refill, you will take 1 tablet once daily.  2. For red yeast rice: take 3 capsules once daily or 2 in am and 1 pm.  REDUCE by one capsule daily. - This may help your cramping.  3. You will be due for FASTING BLOOD WORK.  This means you should eat no food or drink after midnight.  Drink only water or coffee without cream/sugar on the morning of your lab visit. - Please go ahead and schedule a "Lab Only" visit in the morning at the clinic for lab draw in the next 7 days.   - Your results will be available about 2-3 days after blood draw.  If you have set up a MyChart account, you can can log in to MyChart online to view your results and a brief explanation. Also, we can discuss your results together at your next office visit if you would like.   Please schedule a follow-up appointment with [name], AGNP. Return in about 6 weeks (around 08/22/2017) for hypertension and moods.  If you have any other questions or concerns, please feel free to call the clinic or send a message through [place]. You may also schedule an earlier appointment if necessary.  You will receive a survey after today's visit either digitally by e-mail or paper by [employer]. Your experiences and feedback matter to [place].  Please respond so we know how we are doing as we provide care for you.   [name], DNP, AGNP-BC Adult Gerontology Nurse Practitioner [place]

## 2017-07-11 NOTE — Progress Notes (Signed)
Subjective:    Patient ID: Heidi Bell, female    DOB: [birth year], 67 y.o.   MRN: [id number]  Heidi Bell is a 67 y.o. female presenting on 07/11/2017 for Follow-up (hypertension)   HPI  Establish Care New Provider Pt last seen by PCP Dr. Hall Busing several months ago.  Obtain records.    Hypertension - She is checking BP at home or outside of clinic.  Readings 140s-150s/60s - Current medications: losartan 100 mg once daily, atenolol 50 mg once daily, potassium 10 mg once daily and is tolerating well without side effects  - She is not currently symptomatic. - Pt denies headache, lightheadedness, dizziness, changes in vision, chest tightness/pressure, palpitations, leg swelling, sudden loss of speech or loss of consciousness. - She  reports no regular exercise routine. - Her diet is moderate in salt, moderate in fat, and moderate in carbohydrates.   [place] Had prednisone shots in 1980s.  No recent problems until last  - worse with writing with pen/pencil.  Hand has numbness during day and some at sleep  Anxiety and Depression Pt reports much less tearfulness since taking sertraline 25 mg once daily.  She had previously been taking only intermittently.  Has had death of two dogs since 05/22/22, which was difficult for her and disrupted her coping mechanisms for stress.  One was her own and the other was her Sister's dog.  She has not yet gotten a new dog and is still missing this companion.  She does have regular interaction with her sister's new dog.    Depression screen [facility] 2/9 08/22/2017 07/30/2017 07/11/2017 02/26/2017  Decreased Interest 0 0 0 1  Down, Depressed, Hopeless 0 1 0 1  PHQ - 2 Score 0 1 0 2  Altered sleeping 0 1 0 1  Tired, decreased energy 0 1 0 0  Change in appetite 0 0 0 0  Feeling bad or failure about yourself  0 1 0 0  Trouble concentrating 0 0 0 0  Moving slowly or fidgety/restless 0 0 0 0  Suicidal thoughts 0 0 0 0  PHQ-9 Score 0 4 0 3  Difficult  doing work/chores Not difficult at all Not difficult at all Not difficult at all Not difficult at all     Social History   Tobacco Use  . Smoking status: Never Smoker  . Smokeless tobacco: Never Used  Substance Use Topics  . Alcohol use: No    Alcohol/week: 0.0 oz  . Drug use: No    Review of Systems  Constitutional: Negative.   HENT: Negative.   Eyes: Negative.   Respiratory: Negative.   Cardiovascular: Negative.   Gastrointestinal: Negative.   Endocrine: Negative.   Genitourinary: Negative.   Musculoskeletal: Positive for arthralgias.  Skin: Negative.   Allergic/Immunologic: Negative.   Neurological: Negative.   Hematological: Negative.   Psychiatric/Behavioral: Positive for dysphoric mood and sleep disturbance. Negative for suicidal ideas.   Per HPI unless specifically indicated above     Objective:    BP (!) 180/60 (BP Location: Left Arm, Patient Position: Sitting, Cuff Size: Normal) Comment (Cuff Size): manual  Pulse (!) 56   Temp 98.2 F (36.8 C)   Ht 5\' 1"  (1.549 m)   Wt 147 lb (66.7 kg)   BMI 27.78 kg/m   Wt Readings from Last 3 Encounters:  08/22/17 148 lb 12.8 oz (67.5 kg)  07/30/17 145 lb 12.8 oz (66.1 kg)  07/11/17 147 lb (66.7 kg)    Physical Exam  Constitutional: She is oriented to person, place, and time. She appears well-developed and well-nourished. No distress.  HENT:  Head: Normocephalic and atraumatic.  Neck: Normal range of motion. Neck supple. Carotid bruit is not present.  Cardiovascular: Normal rate, regular rhythm, S1 normal, S2 normal, normal heart sounds and intact distal pulses.  Pulmonary/Chest: Effort normal and breath sounds normal. No respiratory distress.  Musculoskeletal: She exhibits no edema (pedal).  Neurological: She is alert and oriented to person, place, and time.  Skin: Skin is warm and dry.  Psychiatric: She has a normal mood and affect. Her behavior is normal. Judgment and thought content normal.  Vitals reviewed.    Results for orders placed or performed in visit on 07/11/17  COMPLETE METABOLIC PANEL WITH GFR  Result Value Ref Range   Glucose, Bld 101 (H) 65 - 99 mg/dL   BUN 23 7 - 25 mg/dL   Creat 0.65 0.50 - 0.99 mg/dL   GFR, Est Non African American 91 > OR = 60 mL/min/1.66m2   GFR, Est African American 105 > OR = 60 mL/min/1.45m2   BUN/Creatinine Ratio NOT APPLICABLE 6 - 22 (calc)   Sodium 142 135 - 146 mmol/L   Potassium 4.2 3.5 - 5.3 mmol/L   Chloride 104 98 - 110 mmol/L   CO2 33 (H) 20 - 32 mmol/L   Calcium 9.8 8.6 - 10.4 mg/dL   Total Protein 7.0 6.1 - 8.1 g/dL   Albumin 4.4 3.6 - 5.1 g/dL   Globulin 2.6 1.9 - 3.7 g/dL (calc)   AG Ratio 1.7 1.0 - 2.5 (calc)   Total Bilirubin 0.7 0.2 - 1.2 mg/dL   Alkaline phosphatase (APISO) 71 33 - 130 U/L   AST 19 10 - 35 U/L   ALT 14 6 - 29 U/L  Lipid panel  Result Value Ref Range   Cholesterol 234 (H) <200 mg/dL   HDL 63 >50 mg/dL   Triglycerides 157 (H) <150 mg/dL   LDL Cholesterol (Calc) 142 (H) mg/dL (calc)   Total CHOL/HDL Ratio 3.7 <5.0 (calc)   Non-HDL Cholesterol (Calc) 171 (H) <130 mg/dL (calc)      Assessment & Plan:   Problem List Items Addressed This Visit      Cardiovascular and Mediastinum   Hypertension Uncontrolled hypertension.  BP goal < 130/80.  Pt is not working on lifestyle modifications.  Taking medications tolerating well without side effects. No recent kidney function check.  Plan: 1. Resume taking medications previously prescribed 2. Obtain labs  3. Encouraged heart healthy diet and increasing exercise to 30 minutes most days of the week. 4. Check BP 1-2 x per week at home, keep log, and bring to clinic at next appointment. 5. Follow up 6 weeks.     Relevant Orders   COMPLETE METABOLIC PANEL WITH GFR (Completed)   Lipid panel (Completed)     Other   Anxiety - Primary Depression Grief Currently worsening anxiety secondary to grief of two beloved pets.  Pt lives alone, but has regular contact with her  sister and brother-in-law.  Plan: 1. Increase sertraline to 50 mg once daily. 2. Encourage pt to find companion pet in future. 3. Encouraged normal grief process today. 4. Followup 6 weeks.    Hyperlipidemia Status unknown.  Recheck labs.  Continue red yeast rice, but increase to 3 capsules daily. Followup with possible med change after labs.    Relevant Orders   COMPLETE METABOLIC PANEL WITH GFR (Completed)   Lipid panel (Completed)  Other Visit Diagnoses    Encounter to establish care     Previous PCP was at Dr. Hall Busing.  Records will be requested.  Past medical, family, and surgical history reviewed w/ pt.        Meds ordered this encounter  Medications  . DISCONTD: sertraline (ZOLOFT) 50 MG tablet    Sig: Take 1 tablet (50 mg total) by mouth daily.    Dispense:  90 tablet    Refill:  1    DO not fill until requested by patient.    Order Specific Question:   Supervising Provider    Answer:   Olin Hauser [[id number]]    Follow up plan: Return in about 6 weeks (around 08/22/2017) for hypertension and moods.  [name], DNP, AGPCNP-BC Adult Gerontology Primary Care Nurse Practitioner [facility] 08/24/2017, 3:39 PM

## 2017-07-18 ENCOUNTER — Other Ambulatory Visit: Payer: PPO

## 2017-07-18 DIAGNOSIS — I10 Essential (primary) hypertension: Secondary | ICD-10-CM | POA: Diagnosis not present

## 2017-07-18 DIAGNOSIS — E785 Hyperlipidemia, unspecified: Secondary | ICD-10-CM | POA: Diagnosis not present

## 2017-07-19 LAB — COMPLETE METABOLIC PANEL WITH GFR
AG Ratio: 1.7 (calc) (ref 1.0–2.5)
ALT: 14 U/L (ref 6–29)
AST: 19 U/L (ref 10–35)
Albumin: 4.4 g/dL (ref 3.6–5.1)
Alkaline phosphatase (APISO): 71 U/L (ref 33–130)
BUN: 23 mg/dL (ref 7–25)
CO2: 33 mmol/L — ABNORMAL HIGH (ref 20–32)
Calcium: 9.8 mg/dL (ref 8.6–10.4)
Chloride: 104 mmol/L (ref 98–110)
Creat: 0.65 mg/dL (ref 0.50–0.99)
GFR, Est African American: 105 mL/min/{1.73_m2} (ref >=60)
GFR, Est Non African American: 91 mL/min/{1.73_m2} (ref >=60)
Globulin: 2.6 g/dL (calc) (ref 1.9–3.7)
Glucose, Bld: 101 mg/dL — ABNORMAL HIGH (ref 65–99)
Potassium: 4.2 mmol/L (ref 3.5–5.3)
Sodium: 142 mmol/L (ref 135–146)
Total Bilirubin: 0.7 mg/dL (ref 0.2–1.2)
Total Protein: 7 g/dL (ref 6.1–8.1)

## 2017-07-19 LAB — LIPID PANEL
Cholesterol: 234 mg/dL — ABNORMAL HIGH (ref <200)
HDL: 63 mg/dL (ref >50)
LDL Cholesterol (Calc): 142 mg/dL (calc) — ABNORMAL HIGH
Non-HDL Cholesterol (Calc): 171 mg/dL (calc) — ABNORMAL HIGH (ref <130)
Total CHOL/HDL Ratio: 3.7 (calc) (ref <5.0)
Triglycerides: 157 mg/dL — ABNORMAL HIGH (ref <150)

## 2017-07-23 ENCOUNTER — Ambulatory Visit: Payer: PPO

## 2017-07-30 ENCOUNTER — Ambulatory Visit (INDEPENDENT_AMBULATORY_CARE_PROVIDER_SITE_OTHER): Payer: PPO

## 2017-07-30 VITALS — BP 160/64 | HR 60 | Temp 98.5°F | Resp 16 | Ht 61.0 in | Wt 145.8 lb

## 2017-07-30 DIAGNOSIS — Z780 Asymptomatic menopausal state: Secondary | ICD-10-CM

## 2017-07-30 DIAGNOSIS — Z0000 Encounter for general adult medical examination without abnormal findings: Secondary | ICD-10-CM | POA: Diagnosis not present

## 2017-07-30 DIAGNOSIS — Z1239 Encounter for other screening for malignant neoplasm of breast: Secondary | ICD-10-CM

## 2017-07-30 DIAGNOSIS — Z1159 Encounter for screening for other viral diseases: Secondary | ICD-10-CM | POA: Diagnosis not present

## 2017-07-30 DIAGNOSIS — Z1231 Encounter for screening mammogram for malignant neoplasm of breast: Secondary | ICD-10-CM

## 2017-07-30 NOTE — Progress Notes (Signed)
Subjective:   Heidi Bell is a 67 y.o. female who presents for an Initial Medicare Annual Wellness Visit.   Review of Systems      Cardiac Risk Factors include: hypertension;advanced age (>10men, >75 women);dyslipidemia     Objective:    Today's Vitals   07/30/17 1407 07/30/17 1433  BP: (!) 178/70 (!) 160/64  Pulse: 60   Resp: 16   Temp: 98.5 F (36.9 C)   TempSrc: Oral   Weight: 145 lb 12.8 oz (66.1 kg)   Height: 5\' 1"  (1.549 m)    Body mass index is 27.55 kg/m.  Advanced Directives 07/30/2017 01/12/2015  Does Patient Have a Medical Advance Directive? Yes No  Type of Advance Directive Living will;Healthcare Power of Attorney -  [place] in Chart? No - copy requested -  Would patient like information on creating a medical advance directive? - No - patient declined information    Current Medications (verified) Outpatient Encounter Medications as of 07/30/2017  Medication Sig  . Ascorbic Acid (VITAMIN C) 500 MG CAPS Take 500 mg by mouth daily.  Marland Kitchen atenolol (TENORMIN) 50 MG tablet TAKE ONE TABLET BY MOUTH ONCE DAILY  . Cholecalciferol (VITAMIN D3) 1000 UNITS CAPS Take by mouth daily.  . Garlic 2119 MG CAPS Take by mouth 2 (two) times daily.   Marland Kitchen glucosamine-chondroitin 500-400 MG tablet Take 1 tablet by mouth 2 (two) times daily.  . hydrochlorothiazide (HYDRODIURIL) 25 MG tablet Take 1 tablet (25 mg total) by mouth daily.  Marland Kitchen losartan (COZAAR) 100 MG tablet Take 1 tablet (100 mg total) by mouth daily.  . Multiple Vitamins-Minerals (MULTIVITAMIN WITH MINERALS) tablet Take 1 tablet by mouth daily.  . Omega-3 Fatty Acids (FISH OIL) 1200 MG CAPS Take 1 capsule (1,200 mg total) by mouth 2 (two) times daily.  . potassium chloride (K-DUR,KLOR-CON) 10 MEQ tablet Take 1 tablet (10 mEq total) by mouth daily.  . Red Yeast Rice 600 MG CAPS Take 3 capsules (1,800 mg total) by mouth daily. (Patient taking differently: Take 1,800 mg by mouth 3 (three) times  daily. )  . sertraline (ZOLOFT) 50 MG tablet Take 1 tablet (50 mg total) by mouth daily.   No facility-administered encounter medications on file as of 07/30/2017.     Allergies (verified) Penicillins   History: Past Medical History:  Diagnosis Date  . Anxiety   . Hyperlipidemia   . Hypertension    Past Surgical History:  Procedure Laterality Date  . ABDOMINAL HYSTERECTOMY    . boil removed  1984  . BREAST CYST ASPIRATION Left 1994   Dr. Bary Castilla  . COLONOSCOPY WITH PROPOFOL N/A 01/12/2015   Procedure: COLONOSCOPY WITH PROPOFOL;  Surgeon: Christene Lye, MD;  Location: [facility] ENDOSCOPY;  Service: Endoscopy;  Laterality: N/A;   Family History  Problem Relation Age of Onset  . Colon cancer Father   . COPD Mother   . Lung cancer Mother   . Bleeding Disorder Mother   . Breast cancer Sister    Social History   Socioeconomic History  . Marital status: Single    Spouse name: Not on file  . Number of children: Not on file  . Years of education: Not on file  . Highest education level: Not on file  Occupational History  . Not on file  Social Needs  . Financial resource strain: Not hard at all  . Food insecurity:    Worry: Never true    Inability: Never true  .  Transportation needs:    Medical: No    Non-medical: No  Tobacco Use  . Smoking status: Never Smoker  . Smokeless tobacco: Never Used  Substance and Sexual Activity  . Alcohol use: No    Alcohol/week: 0.0 oz  . Drug use: No  . Sexual activity: Not on file  Lifestyle  . Physical activity:    Days per week: 0 days    Minutes per session: 0 min  . Stress: Not at all  Relationships  . Social connections:    Talks on phone: More than three times a week    Gets together: More than three times a week    Attends religious service: More than 4 times per year    Active member of club or organization: No    Attends meetings of clubs or organizations: Never    Relationship status: Never married  Other Topics  Concern  . Not on file  Social History Narrative  . Not on file    Tobacco Counseling Counseling given: Not Answered   Clinical Intake:  Pre-visit preparation completed: Yes  Pain : No/denies pain     Nutritional Status: BMI 25 -29 Overweight Nutritional Risks: None Diabetes: No  How often do you need to have someone help you when you read instructions, pamphlets, or other written materials from your doctor or pharmacy?: 1 - Never What is the last grade level you completed in school?: 12th grade  Interpreter Needed?: No  Information entered by :: Heidi Hill,LPN    Activities of Daily Living In your present state of health, do you have any difficulty performing the following activities: 07/30/2017  Hearing? N  Vision? N  Difficulty concentrating or making decisions? N  Walking or climbing stairs? N  Dressing or bathing? N  Doing errands, shopping? N  Preparing Food and eating ? N  Using the Toilet? N  In the past six months, have you accidently leaked urine? N  Do you have problems with loss of bowel control? N  Managing your Medications? N  Managing your Finances? Y  Comment sister Ecologist or managing your Housekeeping? N  Some recent data might be hidden     Immunizations and Health Maintenance  There is no immunization history on file for this patient. Health Maintenance Due  Topic Date Due  . Hepatitis C Screening  12/04/1950  . TETANUS/TDAP  05/06/1967  . MAMMOGRAM  05/05/1998  . DEXA SCAN  05/05/2013    Patient Care Team: Mikey College, NP as PCP - General (Nurse Practitioner) Bary Castilla Forest Gleason, MD as Consulting Physician (General Surgery) Albina Billet, MD as Referring Physician (Internal Medicine)  Indicate any recent Medical Services you may have received from other than [facility] providers in the past year (date may be approximate).     Assessment:   This is a routine wellness examination for Heidi Bell.  Hearing/Vision  screen Vision Screening Comments: Sees Dr.Woodard annually   Dietary issues and exercise activities discussed: Current Exercise Habits: Home exercise routine, Type of exercise: walking;strength training/weights, Time (Minutes): 30, Frequency (Times/Week): 5, Weekly Exercise (Minutes/Week): 150, Intensity: Mild, Exercise limited by: None identified  Goals    . DIET - INCREASE WATER INTAKE     Recommend drinking at least 6-8 glasses of water a day       Depression Screen PHQ 2/9 Scores 07/30/2017 07/11/2017 02/26/2017  PHQ - 2 Score 1 0 2  PHQ- 9 Score 4 0 3    Fall  Risk Fall Risk  07/30/2017 02/26/2017  Falls in the past year? No No    Is the patient's home free of loose throw rugs in walkways, pet beds, electrical cords, etc?   yes      Grab bars in the bathroom? no      Handrails on the stairs?   yes      Adequate lighting?   yes  Timed Get Up and Go Performed Completed in 8 seconds with no use of assistive devices, steady gait. No intervention needed at this time.   Cognitive Function:     6CIT Screen 07/30/2017  What Year? 0 points  What month? 0 points  What time? 0 points  Count back from 20 0 points  Months in reverse 0 points  Repeat phrase 2 points  Total Score 2    Screening Tests Health Maintenance  Topic Date Due  . Hepatitis C Screening  1950-12-07  . TETANUS/TDAP  05/06/1967  . MAMMOGRAM  05/05/1998  . DEXA SCAN  05/05/2013  . PNA vac Low Risk Adult (1 of 2 - PCV13) 07/31/2018 (Originally 05/05/2013)  . INFLUENZA VACCINE  10/31/2017  . COLONOSCOPY  01/11/2025    Qualifies for Shingles Vaccine? Yes, discussed shingrix vaccine   Cancer Screenings: Lung: Low Dose CT Chest recommended if Age 1-80 years, 30 pack-year currently smoking OR have quit w/in 15years. Patient does not qualify. Breast: Up to date on Mammogram? No   Up to date of Bone Density/Dexa? No Colorectal: completed 01/12/2015  Additional Screenings:  Hepatitis C Screening: will order  at next lab draw     Plan:    I have personally reviewed and addressed the Medicare Annual Wellness questionnaire and have noted the following in the patient's chart:  A. Medical and social history B. Use of alcohol, tobacco or illicit drugs  C. Current medications and supplements D. Functional ability and status E.  Nutritional status F.  Physical activity G. Advance directives H. List of other physicians I.  Hospitalizations, surgeries, and ER visits in previous 12 months J.  [place] such as hearing and vision if needed, cognitive and depression L. Referrals and appointments   In addition, I have reviewed and discussed with patient certain preventive protocols, quality metrics, and best practice recommendations. A written personalized care plan for preventive services as well as general preventive health recommendations were provided to patient.   Signed,  Tyler Aas, LPN Nurse Health Advisor   Nurse Notes: none

## 2017-07-30 NOTE — Patient Instructions (Addendum)
Heidi Bell , Thank you for taking time to come for your Medicare Wellness Visit. I appreciate your ongoing commitment to your health goals. Please review the following plan we discussed and let me know if I can assist you in the future.   Screening recommendations/referrals: Colonoscopy: completed 01/12/2015 Mammogram: Please call [phone] to schedule your mammogram.  Bone Density: Please call [phone] to schedule  Recommended yearly ophthalmology/optometry visit for glaucoma screening and checkup Recommended yearly dental visit for hygiene and checkup  Vaccinations: Influenza vaccine: Due 12/2017 Pneumococcal vaccine: declined Tdap vaccine: due, check with your insurance company for coverage  Shingles vaccine: shingrix eligible, check with your insurance company for coverage   Advanced directives: Please bring a copy of your health care power of attorney and living will to the office at your convenience.  Conditions/risks identified: Recommend drinking at least 6-8 glasses of water a day   Next appointment: Follow up on 08/22/2017 at 8:00am with Lissa Merlin. Follow up in one year for your annual wellness exam.   Preventive Care 65 Years and Older, Female Preventive care refers to lifestyle choices and visits with your health care provider that can promote health and wellness. What does preventive care include?  A yearly physical exam. This is also called an annual well check.  Dental exams once or twice a year.  Routine eye exams. Ask your health care provider how often you should have your eyes checked.  Personal lifestyle choices, including:  Daily care of your teeth and gums.  Regular physical activity.  Eating a healthy diet.  Avoiding tobacco and drug use.  Limiting alcohol use.  Practicing safe sex.  Taking low-dose aspirin every day.  Taking vitamin and mineral supplements as recommended by your health care provider. What happens during an annual  well check? The services and screenings done by your health care provider during your annual well check will depend on your age, overall health, lifestyle risk factors, and family history of disease. Counseling  Your health care provider may ask you questions about your:  Alcohol use.  Tobacco use.  Drug use.  Emotional well-being.  Home and relationship well-being.  Sexual activity.  Eating habits.  History of falls.  Memory and ability to understand (cognition).  Work and work Statistician.  Reproductive health. Screening  You may have the following tests or measurements:  Height, weight, and BMI.  Blood pressure.  Lipid and cholesterol levels. These may be checked every 5 years, or more frequently if you are over 31 years old.  Skin check.  Lung cancer screening. You may have this screening every year starting at age 43 if you have a 30-pack-year history of smoking and currently smoke or have quit within the past 15 years.  Fecal occult blood test (FOBT) of the stool. You may have this test every year starting at age 12.  Flexible sigmoidoscopy or colonoscopy. You may have a sigmoidoscopy every 5 years or a colonoscopy every 10 years starting at age 47.  Hepatitis C blood test.  Hepatitis B blood test.  Sexually transmitted disease (STD) testing.  Diabetes screening. This is done by checking your blood sugar (glucose) after you have not eaten for a while (fasting). You may have this done every 1-3 years.  Bone density scan. This is done to screen for osteoporosis. You may have this done starting at age 57.  Mammogram. This may be done every 1-2 years. Talk to your health care provider about how often you should have regular  mammograms. Talk with your health care provider about your test results, treatment options, and if necessary, the need for more tests. Vaccines  Your health care provider may recommend certain vaccines, such as:  Influenza vaccine. This  is recommended every year.  Tetanus, diphtheria, and acellular pertussis (Tdap, Td) vaccine. You may need a Td booster every 10 years.  Zoster vaccine. You may need this after age 75.  Pneumococcal 13-valent conjugate (PCV13) vaccine. One dose is recommended after age 87.  Pneumococcal polysaccharide (PPSV23) vaccine. One dose is recommended after age 38. Talk to your health care provider about which screenings and vaccines you need and how often you need them. This information is not intended to replace advice given to you by your health care provider. Make sure you discuss any questions you have with your health care provider. Document Released: 04/15/2015 Document Revised: 12/07/2015 Document Reviewed: 01/18/2015 Elsevier Interactive Patient Education  2017 [employer] Prevention in the Home Falls can cause injuries. They can happen to people of all ages. There are many things you can do to make your home safe and to help prevent falls. What can I do on the outside of my home?  Regularly fix the edges of walkways and driveways and fix any cracks.  Remove anything that might make you trip as you walk through a door, such as a raised step or threshold.  Trim any bushes or trees on the path to your home.  Use bright outdoor lighting.  Clear any walking paths of anything that might make someone trip, such as rocks or tools.  Regularly check to see if handrails are loose or broken. Make sure that both sides of any steps have handrails.  Any raised decks and porches should have guardrails on the edges.  Have any leaves, snow, or ice cleared regularly.  Use sand or salt on walking paths during winter.  Clean up any spills in your garage right away. This includes oil or grease spills. What can I do in the bathroom?  Use night lights.  Install grab bars by the toilet and in the tub and shower. Do not use towel bars as grab bars.  Use non-skid mats or decals in the tub or  shower.  If you need to sit down in the shower, use a plastic, non-slip stool.  Keep the floor dry. Clean up any water that spills on the floor as soon as it happens.  Remove soap buildup in the tub or shower regularly.  Attach bath mats securely with double-sided non-slip rug tape.  Do not have throw rugs and other things on the floor that can make you trip. What can I do in the bedroom?  Use night lights.  Make sure that you have a light by your bed that is easy to reach.  Do not use any sheets or blankets that are too big for your bed. They should not hang down onto the floor.  Have a firm chair that has side arms. You can use this for support while you get dressed.  Do not have throw rugs and other things on the floor that can make you trip. What can I do in the kitchen?  Clean up any spills right away.  Avoid walking on wet floors.  Keep items that you use a lot in easy-to-reach places.  If you need to reach something above you, use a strong step stool that has a grab bar.  Keep electrical cords out of the way.  Do not use floor polish or wax that makes floors slippery. If you must use wax, use non-skid floor wax.  Do not have throw rugs and other things on the floor that can make you trip. What can I do with my stairs?  Do not leave any items on the stairs.  Make sure that there are handrails on both sides of the stairs and use them. Fix handrails that are broken or loose. Make sure that handrails are as long as the stairways.  Check any carpeting to make sure that it is firmly attached to the stairs. Fix any carpet that is loose or worn.  Avoid having throw rugs at the top or bottom of the stairs. If you do have throw rugs, attach them to the floor with carpet tape.  Make sure that you have a light switch at the top of the stairs and the bottom of the stairs. If you do not have them, ask someone to add them for you. What else can I do to help prevent  falls?  Wear shoes that:  Do not have high heels.  Have rubber bottoms.  Are comfortable and fit you well.  Are closed at the toe. Do not wear sandals.  If you use a stepladder:  Make sure that it is fully opened. Do not climb a closed stepladder.  Make sure that both sides of the stepladder are locked into place.  Ask someone to hold it for you, if possible.  Clearly mark and make sure that you can see:  Any grab bars or handrails.  First and last steps.  Where the edge of each step is.  Use tools that help you move around (mobility aids) if they are needed. These include:  Canes.  Walkers.  Scooters.  Crutches.  Turn on the lights when you go into a dark area. Replace any light bulbs as soon as they burn out.  Set up your furniture so you have a clear path. Avoid moving your furniture around.  If any of your floors are uneven, fix them.  If there are any pets around you, be aware of where they are.  Review your medicines with your doctor. Some medicines can make you feel dizzy. This can increase your chance of falling. Ask your doctor what other things that you can do to help prevent falls. This information is not intended to replace advice given to you by your health care provider. Make sure you discuss any questions you have with your health care provider. Document Released: 01/13/2009 Document Revised: 08/25/2015 Document Reviewed: 04/23/2014 Elsevier Interactive Patient Education  2017 [employer].

## 2017-08-22 ENCOUNTER — Other Ambulatory Visit: Payer: Self-pay

## 2017-08-22 ENCOUNTER — Encounter: Payer: Self-pay | Admitting: Nurse Practitioner

## 2017-08-22 ENCOUNTER — Ambulatory Visit (INDEPENDENT_AMBULATORY_CARE_PROVIDER_SITE_OTHER): Payer: PPO | Admitting: Nurse Practitioner

## 2017-08-22 VITALS — BP 127/39 | HR 58 | Temp 98.2°F | Ht 61.0 in | Wt 148.8 lb

## 2017-08-22 DIAGNOSIS — I10 Essential (primary) hypertension: Secondary | ICD-10-CM | POA: Diagnosis not present

## 2017-08-22 DIAGNOSIS — T50905A Adverse effect of unspecified drugs, medicaments and biological substances, initial encounter: Secondary | ICD-10-CM

## 2017-08-22 DIAGNOSIS — R001 Bradycardia, unspecified: Secondary | ICD-10-CM | POA: Diagnosis not present

## 2017-08-22 DIAGNOSIS — F419 Anxiety disorder, unspecified: Secondary | ICD-10-CM

## 2017-08-22 MED ORDER — HYDROCHLOROTHIAZIDE 25 MG PO TABS: 25.0000 mg | ORAL_TABLET | Freq: Every day | ORAL | 6 refills | Status: DC

## 2017-08-22 MED ORDER — SERTRALINE HCL 50 MG PO TABS: 50.0000 mg | ORAL_TABLET | Freq: Every day | ORAL | 1 refills | Status: DC

## 2017-08-22 MED ORDER — AMLODIPINE BESYLATE 5 MG PO TABS: 5.0000 mg | ORAL_TABLET | Freq: Every day | ORAL | 0 refills | Status: DC

## 2017-08-22 MED ORDER — LOSARTAN POTASSIUM 100 MG PO TABS: 100.0000 mg | ORAL_TABLET | Freq: Every day | ORAL | 1 refills | Status: DC

## 2017-08-22 MED ORDER — AMLODIPINE BESYLATE 5 MG PO TABS: 5.0000 mg | ORAL_TABLET | Freq: Every day | ORAL | 1 refills | Status: DC

## 2017-08-22 NOTE — Patient Instructions (Addendum)
Fredda Hammed "Ardine Bjork",   Thank you for coming in to clinic today.  1. STOP atenolol because of your low heart rate. 2. START amlodipine 5 mg once daily for blood pressure instead.  3. CONTINUE all other medicines without changes.  Please schedule a follow-up appointment with [name], AGNP.  Return in about 3 months (around 11/22/2017) for hypertension, AND in 2 weeks for BP check with CMA.  If you have any other questions or concerns, please feel free to call the clinic or send a message through [place]. You may also schedule an earlier appointment if necessary.  You will receive a survey after today's visit either digitally by e-mail or paper by [employer]. Your experiences and feedback matter to [place].  Please respond so we know how we are doing as we provide care for you.   [name], DNP, AGNP-BC Adult Gerontology Nurse Practitioner [place]

## 2017-08-22 NOTE — Progress Notes (Signed)
Subjective:    Patient ID: Heidi Bell, female    DOB: 23-Nov-1950, 67 y.o.   MRN: [id number]  Heidi Bell is a 67 y.o. female presenting on 08/22/2017 for Hypertension and Anxiety   HPI Hypertension - She is checking BP at home or outside of clinic.  Readings 143/52 at home - Current medications: atenolol 50 mg once daily, losartan 100 mg once daily, hydrochlorothiazide 25 mg once daily tolerating well without side effects - She is not currently symptomatic. - Pt denies headache, lightheadedness, dizziness, changes in vision, chest tightness/pressure, palpitations, leg swelling, sudden loss of speech or loss of consciousness. - She  reports an exercise routine that includes walking, 4-5 days per week, and is regularly active at home. - Her diet is moderate in salt, moderate in fat, and moderate in carbohydrates.   Anxiety Sertraline daily has significantly improved moods.  Doesn't get tearful as much.  Is doing well at this time with the loss of her pet, but still occasionally gets tearful.  Is sleeping better and generally has improved symptoms.  She only has any real trouble with relaxing and delays falling asleep at night by only a few minutes.  Denies SI/HI and has no plans to carry out if SI/HI arise.   Depression screen [facility] 2/9 08/22/2017 07/30/2017 07/11/2017 02/26/2017  Decreased Interest 0 0 0 1  Down, Depressed, Hopeless 0 1 0 1  PHQ - 2 Score 0 1 0 2  Altered sleeping 0 1 0 1  Tired, decreased energy 0 1 0 0  Change in appetite 0 0 0 0  Feeling bad or failure about yourself  0 1 0 0  Trouble concentrating 0 0 0 0  Moving slowly or fidgety/restless 0 0 0 0  Suicidal thoughts 0 0 0 0  PHQ-9 Score 0 4 0 3  Difficult doing work/chores Not difficult at all Not difficult at all Not difficult at all Not difficult at all    GAD 7 : Generalized Anxiety Score 08/22/2017  Nervous, Anxious, on Edge 0  Control/stop worrying 0  Worry too much - different things 0  Trouble  relaxing 1  Restless 0  Easily annoyed or irritable 0  Afraid - awful might happen 0  Total GAD 7 Score 1  Anxiety Difficulty Not difficult at all    Social History   Tobacco Use  . Smoking status: Never Smoker  . Smokeless tobacco: Never Used  Substance Use Topics  . Alcohol use: No    Alcohol/week: 0.0 oz  . Drug use: No    Review of Systems Per HPI unless specifically indicated above     Objective:    BP (!) 127/39 (BP Location: Right Arm, Patient Position: Sitting, Cuff Size: Normal)   Pulse (!) 47   Temp 98.2 F (36.8 C) (Oral)   Ht 5\' 1"  (1.549 m)   Wt 148 lb 12.8 oz (67.5 kg)   BMI 28.12 kg/m   Wt Readings from Last 3 Encounters:  08/22/17 148 lb 12.8 oz (67.5 kg)  07/30/17 145 lb 12.8 oz (66.1 kg)  07/11/17 147 lb (66.7 kg)    Physical Exam  Constitutional: She is oriented to person, place, and time. She appears well-developed and well-nourished. No distress.  HENT:  Head: Normocephalic and atraumatic.  Neck: Normal range of motion. Neck supple. Carotid bruit is not present.  Cardiovascular: Normal rate, regular rhythm, S1 normal, S2 normal, normal heart sounds and intact distal pulses.  Pulmonary/Chest: Effort normal  and breath sounds normal. No respiratory distress.  Musculoskeletal: She exhibits no edema (pedal).  Neurological: She is alert and oriented to person, place, and time.  Skin: Skin is warm and dry. Capillary refill takes less than 2 seconds.  Psychiatric: She has a normal mood and affect. Her behavior is normal.  Vitals reviewed.    Results for orders placed or performed in visit on 07/11/17  COMPLETE METABOLIC PANEL WITH GFR  Result Value Ref Range   Glucose, Bld 101 (H) 65 - 99 mg/dL   BUN 23 7 - 25 mg/dL   Creat 0.65 0.50 - 0.99 mg/dL   GFR, Est Non African American 91 > OR = 60 mL/min/1.99m2   GFR, Est African American 105 > OR = 60 mL/min/1.15m2   BUN/Creatinine Ratio NOT APPLICABLE 6 - 22 (calc)   Sodium 142 135 - 146 mmol/L    Potassium 4.2 3.5 - 5.3 mmol/L   Chloride 104 98 - 110 mmol/L   CO2 33 (H) 20 - 32 mmol/L   Calcium 9.8 8.6 - 10.4 mg/dL   Total Protein 7.0 6.1 - 8.1 g/dL   Albumin 4.4 3.6 - 5.1 g/dL   Globulin 2.6 1.9 - 3.7 g/dL (calc)   AG Ratio 1.7 1.0 - 2.5 (calc)   Total Bilirubin 0.7 0.2 - 1.2 mg/dL   Alkaline phosphatase (APISO) 71 33 - 130 U/L   AST 19 10 - 35 U/L   ALT 14 6 - 29 U/L  Lipid panel  Result Value Ref Range   Cholesterol 234 (H) <200 mg/dL   HDL 63 >50 mg/dL   Triglycerides 157 (H) <150 mg/dL   LDL Cholesterol (Calc) 142 (H) mg/dL (calc)   Total CHOL/HDL Ratio 3.7 <5.0 (calc)   Non-HDL Cholesterol (Calc) 171 (H) <130 mg/dL (calc)      Assessment & Plan:   Problem List Items Addressed This Visit      Cardiovascular and Mediastinum   Hypertension - Primary Controlled hypertension.  BP goal < 130/80.  Pt is working on lifestyle modifications with increasing physical activity.  Taking medications tolerating well without side effects.  Identified on exam is bradycardia in 40s-50s  Plan: 1. Medications: - STOP atenolol - START amlodipine for BP instead - Continue losartan 100 mg once daily - Continue hydrochlorothiazide 25 mg once daily 2. Labs recently checked - kidney function normal  3. Encouraged heart healthy diet and increasing exercise to 30 minutes most days of the week. 4. Check BP 1-2 x per week at home, keep log, and bring to clinic at next appointment. 5. Follow up 3 months.     Relevant Medications   amLODipine (NORVASC) 5 MG tablet   amLODipine (NORVASC) 5 MG tablet (Start on 09/20/2017)   hydrochlorothiazide (HYDRODIURIL) 25 MG tablet   losartan (COZAAR) 100 MG tablet     Other   Anxiety Significantly improved on medications.  Much less tearfulness and less anxiety today on exam.  Medications tolerated without side effects.  Continue at current doses.  Refills provided. Followup 2 months.   Relevant Medications   sertraline (ZOLOFT) 50 MG tablet      Other Visit Diagnoses    Bradycardia, drug induced     Patient with drug-induced bradycardia on exam today.  Patient is asymptomatic.  Stop atenolol.  Start amlodipine instead.  See HTN plan above.      Meds ordered this encounter  Medications  . amLODipine (NORVASC) 5 MG tablet    Sig: Take 1 tablet (5  mg total) by mouth daily.    Dispense:  30 tablet    Refill:  0    Order Specific Question:   Supervising Provider    Answer:   Olin Hauser [[id number]]  . amLODipine (NORVASC) 5 MG tablet    Sig: Take 1 tablet (5 mg total) by mouth daily.    Dispense:  90 tablet    Refill:  1    Order Specific Question:   Supervising Provider    Answer:   Olin Hauser [[id number]]  . hydrochlorothiazide (HYDRODIURIL) 25 MG tablet    Sig: Take 1 tablet (25 mg total) by mouth daily.    Dispense:  30 tablet    Refill:  6    Order Specific Question:   Supervising Provider    Answer:   Olin Hauser [[id number]]  . losartan (COZAAR) 100 MG tablet    Sig: Take 1 tablet (100 mg total) by mouth daily.    Dispense:  90 tablet    Refill:  1    Order Specific Question:   Supervising Provider    Answer:   Olin Hauser [[id number]]  . sertraline (ZOLOFT) 50 MG tablet    Sig: Take 1 tablet (50 mg total) by mouth daily.    Dispense:  90 tablet    Refill:  1    DO not fill until requested by patient.    Order Specific Question:   Supervising Provider    Answer:   Olin Hauser [[id number]]    Follow up plan: Return in about 3 months (around 11/22/2017) for hypertension, AND in 2 weeks for BP check with CMA.  [name], DNP, AGPCNP-BC Adult Gerontology Primary Care Nurse Practitioner [facility] 08/22/2017, 8:48 AM

## 2017-08-23 MED ORDER — HYDROCHLOROTHIAZIDE 25 MG PO TABS: 25.0000 mg | ORAL_TABLET | Freq: Every day | ORAL | 1 refills | Status: DC

## 2017-08-24 ENCOUNTER — Encounter: Payer: Self-pay | Admitting: Nurse Practitioner

## 2017-09-05 ENCOUNTER — Ambulatory Visit: Payer: PPO

## 2017-09-05 VITALS — BP 142/53 | HR 78 | Resp 16

## 2017-09-05 DIAGNOSIS — I10 Essential (primary) hypertension: Secondary | ICD-10-CM

## 2017-09-07 ENCOUNTER — Encounter: Payer: Self-pay | Admitting: Nurse Practitioner

## 2017-09-11 ENCOUNTER — Telehealth: Payer: Self-pay

## 2017-09-11 NOTE — Telephone Encounter (Signed)
I called the pt to clarify her concerns with Losartan. She verbalize understanding, no questions or concerns.

## 2017-09-29 ENCOUNTER — Encounter: Payer: Self-pay | Admitting: Nurse Practitioner

## 2017-09-30 ENCOUNTER — Encounter: Payer: Self-pay | Admitting: Nurse Practitioner

## 2017-10-22 ENCOUNTER — Other Ambulatory Visit: Payer: Self-pay | Admitting: Nurse Practitioner

## 2017-10-30 ENCOUNTER — Ambulatory Visit
Admission: RE | Admit: 2017-10-30 | Discharge: 2017-10-30 | Disposition: A | Discharge status: Home / Self Care | Payer: PPO | Source: Ambulatory Visit | Attending: Family Medicine | Admitting: Family Medicine

## 2017-10-30 ENCOUNTER — Other Ambulatory Visit: Payer: Self-pay | Admitting: Family Medicine

## 2017-10-30 DIAGNOSIS — R928 Other abnormal and inconclusive findings on diagnostic imaging of breast: Secondary | ICD-10-CM

## 2017-10-30 DIAGNOSIS — H43811 Vitreous degeneration, right eye: Secondary | ICD-10-CM | POA: Diagnosis not present

## 2017-10-30 DIAGNOSIS — N6320 Unspecified lump in the left breast, unspecified quadrant: Secondary | ICD-10-CM

## 2017-10-30 DIAGNOSIS — Z1231 Encounter for screening mammogram for malignant neoplasm of breast: Secondary | ICD-10-CM | POA: Insufficient documentation

## 2017-10-30 DIAGNOSIS — Z1239 Encounter for other screening for malignant neoplasm of breast: Secondary | ICD-10-CM

## 2017-10-30 DIAGNOSIS — Z780 Asymptomatic menopausal state: Secondary | ICD-10-CM | POA: Diagnosis not present

## 2017-10-30 DIAGNOSIS — H2513 Age-related nuclear cataract, bilateral: Secondary | ICD-10-CM | POA: Diagnosis not present

## 2017-10-30 DIAGNOSIS — M8589 Other specified disorders of bone density and structure, multiple sites: Secondary | ICD-10-CM | POA: Diagnosis not present

## 2017-10-31 ENCOUNTER — Encounter: Payer: Self-pay | Admitting: Nurse Practitioner

## 2017-10-31 DIAGNOSIS — F419 Anxiety disorder, unspecified: Secondary | ICD-10-CM

## 2017-10-31 HISTORY — PX: BREAST CYST ASPIRATION: SHX578

## 2017-11-01 MED ORDER — SERTRALINE HCL 50 MG PO TABS: 50.0000 mg | ORAL_TABLET | Freq: Every day | ORAL | 0 refills | Status: DC

## 2017-11-04 ENCOUNTER — Encounter: Payer: Self-pay | Admitting: Nurse Practitioner

## 2017-11-05 ENCOUNTER — Other Ambulatory Visit: Payer: Self-pay | Admitting: Nurse Practitioner

## 2017-11-05 DIAGNOSIS — E876 Hypokalemia: Secondary | ICD-10-CM

## 2017-11-05 MED ORDER — POTASSIUM CHLORIDE CRYS ER 10 MEQ PO TBCR: 10.0000 meq | EXTENDED_RELEASE_TABLET | Freq: Every day | ORAL | 1 refills | Status: DC

## 2017-11-06 ENCOUNTER — Other Ambulatory Visit: Payer: Self-pay | Admitting: Nurse Practitioner

## 2017-11-06 DIAGNOSIS — E876 Hypokalemia: Secondary | ICD-10-CM

## 2017-11-06 MED ORDER — POTASSIUM CHLORIDE CRYS ER 10 MEQ PO TBCR: 20.0000 meq | EXTENDED_RELEASE_TABLET | Freq: Every day | ORAL | 1 refills | Status: DC

## 2017-11-06 NOTE — Progress Notes (Signed)
Pharmacy last filled 20 meq daily.  Called to clarify.  Patient had not reported correct dose.  Rx changed.

## 2017-11-11 ENCOUNTER — Ambulatory Visit
Admission: RE | Admit: 2017-11-11 | Discharge: 2017-11-11 | Disposition: A | Discharge status: Home / Self Care | Payer: PPO | Source: Ambulatory Visit | Attending: Family Medicine | Admitting: Family Medicine

## 2017-11-11 ENCOUNTER — Other Ambulatory Visit: Payer: Self-pay | Admitting: Family Medicine

## 2017-11-11 DIAGNOSIS — R928 Other abnormal and inconclusive findings on diagnostic imaging of breast: Secondary | ICD-10-CM | POA: Insufficient documentation

## 2017-11-11 DIAGNOSIS — N6320 Unspecified lump in the left breast, unspecified quadrant: Secondary | ICD-10-CM

## 2017-11-11 DIAGNOSIS — R922 Inconclusive mammogram: Secondary | ICD-10-CM | POA: Diagnosis not present

## 2017-11-13 ENCOUNTER — Other Ambulatory Visit: Payer: Self-pay | Admitting: Family Medicine

## 2017-11-13 ENCOUNTER — Ambulatory Visit
Admission: RE | Admit: 2017-11-13 | Discharge: 2017-11-13 | Disposition: A | Discharge status: Home / Self Care | Payer: PPO | Source: Ambulatory Visit | Attending: Family Medicine | Admitting: Family Medicine

## 2017-11-13 DIAGNOSIS — N6320 Unspecified lump in the left breast, unspecified quadrant: Secondary | ICD-10-CM

## 2017-11-13 DIAGNOSIS — N6002 Solitary cyst of left breast: Secondary | ICD-10-CM | POA: Insufficient documentation

## 2017-11-13 DIAGNOSIS — R928 Other abnormal and inconclusive findings on diagnostic imaging of breast: Secondary | ICD-10-CM | POA: Diagnosis present

## 2017-11-14 ENCOUNTER — Encounter: Payer: Self-pay | Admitting: Nurse Practitioner

## 2017-11-21 ENCOUNTER — Ambulatory Visit (INDEPENDENT_AMBULATORY_CARE_PROVIDER_SITE_OTHER): Payer: PPO | Admitting: Family Medicine

## 2017-11-21 ENCOUNTER — Other Ambulatory Visit: Payer: Self-pay | Admitting: Family Medicine

## 2017-11-21 ENCOUNTER — Encounter: Payer: Self-pay | Admitting: Family Medicine

## 2017-11-21 VITALS — BP 136/74 | HR 75 | Temp 97.9°F | Resp 16 | Ht 61.0 in | Wt 149.6 lb

## 2017-11-21 DIAGNOSIS — M85851 Other specified disorders of bone density and structure, right thigh: Secondary | ICD-10-CM | POA: Insufficient documentation

## 2017-11-21 DIAGNOSIS — I10 Essential (primary) hypertension: Secondary | ICD-10-CM | POA: Diagnosis not present

## 2017-11-21 DIAGNOSIS — R7309 Other abnormal glucose: Secondary | ICD-10-CM

## 2017-11-21 DIAGNOSIS — E782 Mixed hyperlipidemia: Secondary | ICD-10-CM

## 2017-11-21 DIAGNOSIS — F419 Anxiety disorder, unspecified: Secondary | ICD-10-CM

## 2017-11-21 DIAGNOSIS — E559 Vitamin D deficiency, unspecified: Secondary | ICD-10-CM | POA: Diagnosis not present

## 2017-11-21 DIAGNOSIS — Z0000 Encounter for general adult medical examination without abnormal findings: Secondary | ICD-10-CM

## 2017-11-21 DIAGNOSIS — T07XXXA Unspecified multiple injuries, initial encounter: Secondary | ICD-10-CM | POA: Diagnosis not present

## 2017-11-21 DIAGNOSIS — Z1159 Encounter for screening for other viral diseases: Secondary | ICD-10-CM

## 2017-11-21 NOTE — Progress Notes (Signed)
Subjective:    Patient ID: Heidi Bell, female    DOB: [birth year], 67 y.o.   MRN: [id number]  Heidi Bell is a 67 y.o. female presenting on 11/21/2017 for Hypertension  Patient is accompanied by sister, Heidi Bell [facility]), who provides additional history.  Requested that sister, Heidi Bell, to be called primary contact. She lives in [place], but lives locally with other sister Heidi Bell.  HPI  CHRONIC HTN: Reports she checks BP at home regularly, her home machine was running avg +10 points SBP higher than our office she sees 120-130s. Takes after BP meds in AM. Current Meds - HCTZ 25mg  daily, Losartan 100mg , Amlodipine 5mg  daily - Also takes Potassium 70mEq x 2 per dose, asking about supplements today Reports good compliance, took meds today. Tolerating well, w/o complaints. Denies CP, dyspnea, HA, edema, dizziness / lightheadedness  Chronic Anxiety, some mixed depression Reports chronic anxiety, for most of life she describes. She also has some mixed mood symptoms. See scores below, has been on SSRI. Taking Sertraline 50mg  daily in past w/ Heidi Bell was more intermittent, now taking daily.  Osteopenia, Right Femur Recent DEXA done 10/30/17, showed T Score -2.4 She is currently on Vitamin D and Calcium, asking about dosing, taking extra combo pill Ca/Vit D now along with D3.  Follow-up Fall in Parking Lot / Facial Abrasion Reported accidental trip and fall this morning as she was walking into clinic today, she was carrying many things and lost balance while walking and had abrasion on forehead and leg and arm. Staff was alerted to this and they assisted her inside with wheelchair and helped cleanse some of her wounds with saline, peroxide and ointment and bandaids - She states did not have any related symptoms prior to fall and none after, just some soreness from abrasion Denies dizziness, vertigo, weakness, numbness tingling, chest pain, dyspnea, loss of consciousness  Health  Maintenance:  Due routine Hep C, will get with future labs  Due Tdap  Due Flu Vaccine will return.  Depression screen [facility] 2/9 11/21/2017 08/22/2017 07/30/2017  Decreased Interest 0 0 0  Down, Depressed, Hopeless 0 0 1  PHQ - 2 Score 0 0 1  Altered sleeping 1 0 1  Tired, decreased energy 0 0 1  Change in appetite 0 0 0  Feeling bad or failure about yourself  1 0 1  Trouble concentrating 0 0 0  Moving slowly or fidgety/restless 1 0 0  Suicidal thoughts 0 0 0  PHQ-9 Score 3 0 4  Difficult doing work/chores Not difficult at all Not difficult at all Not difficult at all   GAD 7 : Generalized Anxiety Score 11/21/2017 08/22/2017  Nervous, Anxious, on Edge 2 0  Control/stop worrying 2 0  Worry too much - different things 1 0  Trouble relaxing 3 1  Restless 3 0  Easily annoyed or irritable 2 0  Afraid - awful might happen 2 0  Total GAD 7 Score 15 1  Anxiety Difficulty Not difficult at all Not difficult at all    Social History   Tobacco Use  . Smoking status: Never Smoker  . Smokeless tobacco: Never Used  Substance Use Topics  . Alcohol use: No    Alcohol/week: 0.0 standard drinks  . Drug use: No    Review of Systems Per HPI unless specifically indicated above     Objective:    BP 136/74 (BP Location: Left Arm, Cuff Size: Normal)   Pulse 75   Temp 97.9 F (  36.6 C) (Oral)   Resp 16   Ht 5\' 1"  (1.549 m)   Wt 149 lb 9.6 oz (67.9 kg)   BMI 28.27 kg/m   Wt Readings from Last 3 Encounters:  11/21/17 149 lb 9.6 oz (67.9 kg)  08/22/17 148 lb 12.8 oz (67.5 kg)  07/30/17 145 lb 12.8 oz (66.1 kg)    Physical Exam  Constitutional: She is oriented to person, place, and time. She appears well-developed and well-nourished. No distress.  Well-appearing, comfortable, cooperative  HENT:  Head: Normocephalic and atraumatic.  Mouth/Throat: Oropharynx is clear and moist.  Eyes: Conjunctivae are normal. Right eye exhibits no discharge. Left eye exhibits no discharge.  Neck: Normal  range of motion. Neck supple. No thyromegaly present.  Cardiovascular: Normal rate, regular rhythm, normal heart sounds and intact distal pulses.  No murmur heard. Pulmonary/Chest: Effort normal and breath sounds normal. No respiratory distress. She has no wheezes. She has no rales.  Musculoskeletal: Normal range of motion. She exhibits no edema.  Lymphadenopathy:    She has no cervical adenopathy.  Neurological: She is alert and oriented to person, place, and time.  Skin: Skin is warm and dry. No rash noted. She is not diaphoretic. No erythema.  Multiple abrasions, superficial - Left forehead and left lower leg and forearm. After fall today, already cleansed and with bandages, without complication.  Psychiatric: She has a normal mood and affect. Her behavior is normal.  Well groomed, good eye contact. Mildly anxious appearing.  Nursing note and vitals reviewed.    I have personally reviewed the radiology report from 10/30/17 DEXA.  EXAM: DUAL X-RAY ABSORPTIOMETRY (DXA) FOR BONE MINERAL DENSITY  IMPRESSION: Dear Heidi. Parks Bell,  Your patient Heidi Bell on 10/30/2017 using the [place] (analysis version: 14.10) manufactured by [employer]. The following summarizes the results of our evaluation.  PATIENT BIOGRAPHICAL: Name: Heidi Bell Patient ID:  [id number]  Birth Date: 02-24-1951  Height:     62.0 in.    Weight:     150.0 lbs. Gender:      Female  Exam Date:  10/30/2017 Indications: Caucasian, Height Loss, Hysterectomy, Low Calcium Intake Fractures: Treatments: Calcium, Multi-Vitamin, Vitamin D  ASSESSMENT: The BMD measured at Femur Neck Right is 0.710 g/cm2 with a T-score of -2.4.  This patient is considered OSTEOPENIC according to [facility]) criteria.  Site Region Measured Measured WHO Young Adult BMD Date       Age      Classification T-score  AP Spine L1-L4 10/30/[age] Osteopenia  -1.9 0.956 g/cm2  DualFemur Neck Right 10/30/2017 67.4 Osteopenia -2.4 0.710 g/cm2  World Health Organization [facility]) criteria for post-menopausal, Caucasian Women: Normal:       T-score at or above -1 SD Osteopenia:   T-score between -1 and -2.5 SD Osteoporosis: T-score at or below -2.5 SD  RECOMMENDATIONS: 1. All patients should optimize calcium and vitamin D intake. 2. Consider FDA-approved medical therapies in postmenopausal women and men aged 22 years and older, based on the following: a. A hip or vertebral(clinical or morphometric) fracture b. T-score < -2.5 at the femoral neck or spine after appropriate evaluation to exclude secondary causes c. Low bone mass (T-score between -1.0 and -2.5 at the femoral neck or spine) and a 10-year probability of a hip fracture > 3% or a 10-year probability of a major osteoporosis-related fracture > 20% based on the [place]-adapted WHO algorithm d. Clinician judgment and/or patient preferences may indicate  treatment for people with 10-year fracture probabilities above or below these levels  FOLLOW-UP: People with diagnosed cases of osteoporosis or at high risk for fracture should have regular bone mineral density tests. For patients eligible for Medicare, routine testing is allowed once every 2 years. The testing frequency can be increased to one year for patients who have rapidly progressing disease, those who are receiving or discontinuing medical therapy to restore bone mass, or have additional risk factors.  I have reviewed this report, and agree with the above findings.  Mark A. Thornton Papas, M.D. [facility]  Dear Heidi. Parks Bell,  Your patient AVY BARLETT completed a FRAX assessment on 10/30/2017 using the [place] (analysis version: 14.10) manufactured by [employer]. The following summarizes the results of our evaluation.  PATIENT BIOGRAPHICAL: Name: Ashritha, [place] Patient ID: [id number] Birth  Date: 12-30-1950 Height:    62.0 in. Gender:     Female    [age]       Weight:    150.0 lbs. Ethnicity:  White                            Exam Date: 10/30/2017  FRAX* RESULTS:  (version: 3.5) 10-year Probability of Fracture1 Major Osteoporotic Fracture2 Hip Fracture 13.7% 3.2% Population: [place] (Caucasian) Risk Factors: None  Based on Femur (Right) Neck BMD  1 -The 10-year probability of fracture may be lower than reported if the patient has received treatment. 2 -Major Osteoporotic Fracture: Clinical Spine, Forearm, Hip or Shoulder  *FRAX is a Materials engineer of the [employer] for Metabolic Bone Disease, a [place] (WHO) [facility].  ASSESSMENT: The probability of a major osteoporotic fracture is 13.7% within the next ten years. The probability of a hip fracture is 3.2% within the next ten years.  I have reviewed this report and agree with the above findings.  Mark A. Thornton Papas, M.D.  [facility]   Electronically Signed   By: Lavonia Dana M.D.   On: 10/30/2017 09:11  Results for orders placed or performed in visit on 07/11/17  COMPLETE METABOLIC PANEL WITH GFR  Result Value Ref Range   Glucose, Bld 101 (H) 65 - 99 mg/dL   BUN 23 7 - 25 mg/dL   Creat 0.65 0.50 - 0.99 mg/dL   GFR, Est Non African American 91 > OR = 60 mL/min/1.38m2   GFR, Est African American 105 > OR = 60 mL/min/1.65m2   BUN/Creatinine Ratio NOT APPLICABLE 6 - 22 (calc)   Sodium 142 135 - 146 mmol/L   Potassium 4.2 3.5 - 5.3 mmol/L   Chloride 104 98 - 110 mmol/L   CO2 33 (H) 20 - 32 mmol/L   Calcium 9.8 8.6 - 10.4 mg/dL   Total Protein 7.0 6.1 - 8.1 g/dL   Albumin 4.4 3.6 - 5.1 g/dL   Globulin 2.6 1.9 - 3.7 g/dL (calc)   AG Ratio 1.7 1.0 - 2.5 (calc)   Total Bilirubin 0.7 0.2 - 1.2 mg/dL   Alkaline phosphatase (APISO) 71 33 - 130 U/L   AST 19 10 - 35 U/L   ALT 14 6 - 29 U/L  Lipid panel  Result Value Ref  Range   Cholesterol 234 (H) <200 mg/dL   HDL 63 >50 mg/dL   Triglycerides 157 (H) <150 mg/dL   LDL Cholesterol (Calc) 142 (H) mg/dL (calc)   Total  CHOL/HDL Ratio 3.7 <5.0 (calc)   Non-HDL Cholesterol (Calc) 171 (H) <130 mg/dL (calc)      Assessment & Plan:   Problem List Items Addressed This Visit    Anxiety    Chronic problem Stable controlled on SSRI Clinically has some generalized anxiety. Question if other underlying issue.  Plan Continue Sertraline 25mg  daily Follow-up - consider dose adjust or change in future if needed      Relevant Medications   sertraline (ZOLOFT) 50 MG tablet   Hypertension - Primary    Mildly elevated initial BP, repeat manual check improved - Home BP readings reported normal SBP [phone]  No known complications   Plan:  1. Continue current BP regimen - HCTZ 25mg  daily, Losartan 100mg , Amlodipine 5mg  daily - Continue Potassium 6mEq BID while on thiazide 2. Encourage improved lifestyle - low sodium diet, regular exercise 3. Continue monitor BP outside office, bring readings to next visit, if persistently >140/90 or new symptoms notify office sooner 4. Follow-up 3 months for annual and labs  Check BMET today for K result and notify if need to adjust K supplement, likely can reduce to ONCE daily 60mEq       Relevant Orders   BASIC METABOLIC PANEL WITH GFR   Osteopenia of neck of right femur    Based on last DEXA 09/2017, T-2.4 Continue vitamin D / calcium supplement Will check current vitamin D level If normal range resume maintenance either Vitamin D3 1000 + Ca600/VitD 800 x 1 daily or can double up on Ca/Vit D pill x 2 daily  Future can discuss bisphosphonates      Vitamin D deficiency    Prior history low Vit D Recent DEXA osteopenia Will check level Already on maintenance supplement, adjust dose as indicated      Relevant Orders   VITAMIN D 25 Hydroxy (Vit-D Deficiency, Fractures)    Other Visit Diagnoses    Abrasion, multiple  sites       From accidental fall today, no other complications or other injury, wounds dressed, reassured.      No orders of the defined types were placed in this encounter.   Follow up plan: Return in about 3 months (around 02/21/2018) for Annual Physical.  Future labs ordered for 02/12/18 order Hep C - cancel if they call us back if not covered.  Nobie Putnam, [facility] 11/21/2017, 4:59 PM

## 2017-11-21 NOTE — Assessment & Plan Note (Signed)
Chronic problem Stable controlled on SSRI Clinically has some generalized anxiety. Question if other underlying issue.  Plan Continue Sertraline 25mg  daily Follow-up - consider dose adjust or change in future if needed

## 2017-11-21 NOTE — Assessment & Plan Note (Addendum)
Based on last DEXA 09/2017, T-2.4 Continue vitamin D / calcium supplement Will check current vitamin D level If normal range resume maintenance either Vitamin D3 1000 + Ca600/VitD 800 x 1 daily or can double up on Ca/Vit D pill x 2 daily  Future can discuss bisphosphonates

## 2017-11-21 NOTE — Assessment & Plan Note (Signed)
Mildly elevated initial BP, repeat manual check improved - Home BP readings reported normal SBP [phone]  No known complications   Plan:  1. Continue current BP regimen - HCTZ 25mg  daily, Losartan 100mg , Amlodipine 5mg  daily - Continue Potassium 34mEq BID while on thiazide 2. Encourage improved lifestyle - low sodium diet, regular exercise 3. Continue monitor BP outside office, bring readings to next visit, if persistently >140/90 or new symptoms notify office sooner 4. Follow-up 3 months for annual and labs  Check BMET today for K result and notify if need to adjust K supplement, likely can reduce to ONCE daily 65mEq

## 2017-11-21 NOTE — Assessment & Plan Note (Signed)
Prior history low Vit D Recent DEXA osteopenia Will check level Already on maintenance supplement, adjust dose as indicated

## 2017-11-21 NOTE — Patient Instructions (Addendum)
Thank you for coming to the office today.  No change to current Blood Pressure medications at this time.  We will check blood today for Vitamin D and Potassium  If Vitamin D is normal then we will recommend REDUCING the supplements - VItamin D3 1000 - one a day - Calcium 600 and Vitamin D3 800 - can take one a day  In future - you may STOP Vitamin D3 1000 - and SWICH TO the - Calcium 600 and Vitamin D3 800 and take TWO a day ONLY  -------  Please call your Medicare Insurance Plan ([employer]) to speak with benefits and ask what the cost and coverage is for a "Annual Physical" (performed by your doctor) - Also ask about Hepatitis C Screening blood test  - Due to changes with Medicare and the Medicare advantage plans, we need to confirm this BEFORE we perform a physical and send out the charge. - All Medicare plans should cover an "Annual Medicare Wellness" (performed by a nurse health advisor, not doctor) - this is FREE and can be done once a year your last one was done at our office in April 2019 -  Next due April 2020  Please schedule and return for a NURSE ONLY VISIT for VACCINE - Approximately around October to November 2019 - Need High Dose Flu Vaccine  DUE for FASTING BLOOD WORK (no food or drink after midnight before the lab appointment, only water or coffee without cream/sugar on the morning of)  SCHEDULE "Lab Only" visit in the morning at the clinic for lab draw in 3 MONTHS   - Make sure Lab Only appointment is at about 1 week before your next appointment, so that results will be available  For Lab Results, once available within 2-3 days of blood draw, you can can log in to MyChart online to view your results and a brief explanation. Also, we can discuss results at next follow-up visit.   Please schedule a Follow-up Appointment to: Return in about 3 months (around 02/21/2018) for Annual Physical.  If you have any other questions or concerns, please feel free to  call the office or send a message through [employer]. You may also schedule an earlier appointment if necessary.  Additionally, you may be receiving a survey about your experience at our office within a few days to 1 week by e-mail or mail. We value your feedback.  Nobie Putnam, DO [place]

## 2017-11-22 ENCOUNTER — Ambulatory Visit: Payer: PPO | Admitting: Nurse Practitioner

## 2017-11-22 ENCOUNTER — Other Ambulatory Visit: Payer: Self-pay | Admitting: Family Medicine

## 2017-11-22 LAB — BASIC METABOLIC PANEL WITH GFR
BUN: 23 mg/dL (ref 7–25)
CALCIUM: 10.4 mg/dL (ref 8.6–10.4)
CHLORIDE: 100 mmol/L (ref 98–110)
CO2: 31 mmol/L (ref 20–32)
Creat: 0.71 mg/dL (ref 0.50–0.99)
GFR, EST AFRICAN AMERICAN: 101 mL/min/{1.73_m2} (ref >=60)
GFR, EST NON AFRICAN AMERICAN: 87 mL/min/{1.73_m2} (ref >=60)
Glucose, Bld: 126 mg/dL (ref 65–139)
POTASSIUM: 3.8 mmol/L (ref 3.5–5.3)
SODIUM: 142 mmol/L (ref 135–146)

## 2017-11-22 LAB — VITAMIN D 25 HYDROXY (VIT D DEFICIENCY, FRACTURES): Vit D, 25-Hydroxy: 43 ng/mL (ref 30–100)

## 2018-02-04 ENCOUNTER — Other Ambulatory Visit: Payer: Self-pay | Admitting: Nurse Practitioner

## 2018-02-04 DIAGNOSIS — I10 Essential (primary) hypertension: Secondary | ICD-10-CM

## 2018-02-12 ENCOUNTER — Other Ambulatory Visit: Payer: PPO

## 2018-02-12 DIAGNOSIS — E782 Mixed hyperlipidemia: Secondary | ICD-10-CM | POA: Diagnosis not present

## 2018-02-12 DIAGNOSIS — F419 Anxiety disorder, unspecified: Secondary | ICD-10-CM | POA: Diagnosis not present

## 2018-02-12 DIAGNOSIS — Z1159 Encounter for screening for other viral diseases: Secondary | ICD-10-CM

## 2018-02-12 DIAGNOSIS — Z0000 Encounter for general adult medical examination without abnormal findings: Secondary | ICD-10-CM

## 2018-02-12 DIAGNOSIS — R7309 Other abnormal glucose: Secondary | ICD-10-CM

## 2018-02-12 DIAGNOSIS — I10 Essential (primary) hypertension: Secondary | ICD-10-CM

## 2018-02-13 LAB — CBC WITH DIFFERENTIAL/PLATELET
BASOS ABS: 91 {cells}/uL (ref 0–200)
BASOS PCT: 1.3 %
EOS PCT: 2.3 %
Eosinophils Absolute: 161 cells/uL (ref 15–500)
HCT: 38.6 % (ref 35.0–45.0)
HEMOGLOBIN: 13.3 g/dL (ref 11.7–15.5)
Lymphs Abs: 1988 cells/uL (ref 850–3900)
MCH: 30.4 pg (ref 27.0–33.0)
MCHC: 34.5 g/dL (ref 32.0–36.0)
MCV: 88.3 fL (ref 80.0–100.0)
MONOS PCT: 9.4 %
MPV: 11.5 fL (ref 7.5–12.5)
NEUTROS ABS: 4102 {cells}/uL (ref 1500–7800)
Neutrophils Relative %: 58.6 %
Platelets: 224 10*3/uL (ref 140–400)
RBC: 4.37 10*6/uL (ref 3.80–5.10)
RDW: 12.3 % (ref 11.0–15.0)
Total Lymphocyte: 28.4 %
WBC mixed population: 658 cells/uL (ref 200–950)
WBC: 7 10*3/uL (ref 3.8–10.8)

## 2018-02-13 LAB — COMPLETE METABOLIC PANEL WITH GFR
AG RATIO: 1.6 (calc) (ref 1.0–2.5)
ALT: 16 U/L (ref 6–29)
AST: 21 U/L (ref 10–35)
Albumin: 4.5 g/dL (ref 3.6–5.1)
Alkaline phosphatase (APISO): 73 U/L (ref 33–130)
BILIRUBIN TOTAL: 0.9 mg/dL (ref 0.2–1.2)
BUN: 21 mg/dL (ref 7–25)
CHLORIDE: 102 mmol/L (ref 98–110)
CO2: 31 mmol/L (ref 20–32)
Calcium: 10.4 mg/dL (ref 8.6–10.4)
Creat: 0.61 mg/dL (ref 0.50–0.99)
GFR, Est African American: 107 mL/min/{1.73_m2} (ref >=60)
GFR, Est Non African American: 92 mL/min/{1.73_m2} (ref >=60)
GLUCOSE: 93 mg/dL (ref 65–99)
Globulin: 2.8 g/dL (calc) (ref 1.9–3.7)
POTASSIUM: 4 mmol/L (ref 3.5–5.3)
Sodium: 142 mmol/L (ref 135–146)
Total Protein: 7.3 g/dL (ref 6.1–8.1)

## 2018-02-13 LAB — LIPID PANEL
CHOL/HDL RATIO: 3.8 (calc) (ref <5.0)
Cholesterol: 264 mg/dL — ABNORMAL HIGH (ref <200)
HDL: 69 mg/dL (ref >50)
LDL Cholesterol (Calc): 173 mg/dL (calc) — ABNORMAL HIGH
NON-HDL CHOLESTEROL (CALC): 195 mg/dL — AB (ref <130)
TRIGLYCERIDES: 101 mg/dL (ref <150)

## 2018-02-13 LAB — HEMOGLOBIN A1C
EAG (MMOL/L): 6.5 (calc)
Hgb A1c MFr Bld: 5.7 % of total Hgb — ABNORMAL HIGH (ref <5.7)
MEAN PLASMA GLUCOSE: 117 (calc)

## 2018-02-13 LAB — HEPATITIS C ANTIBODY
HEP C AB: NONREACTIVE
SIGNAL TO CUT-OFF: 0.02 (ref <1.00)

## 2018-02-13 LAB — TSH: TSH: 1.73 mIU/L (ref 0.40–4.50)

## 2018-02-16 ENCOUNTER — Encounter: Payer: Self-pay | Admitting: Family Medicine

## 2018-02-16 DIAGNOSIS — R7309 Other abnormal glucose: Secondary | ICD-10-CM | POA: Insufficient documentation

## 2018-02-18 ENCOUNTER — Ambulatory Visit (INDEPENDENT_AMBULATORY_CARE_PROVIDER_SITE_OTHER): Payer: PPO | Admitting: Family Medicine

## 2018-02-18 ENCOUNTER — Encounter: Payer: Self-pay | Admitting: Family Medicine

## 2018-02-18 VITALS — BP 148/58 | HR 77 | Temp 98.3°F | Resp 16 | Ht 61.0 in | Wt 147.6 lb

## 2018-02-18 DIAGNOSIS — Z0000 Encounter for general adult medical examination without abnormal findings: Secondary | ICD-10-CM | POA: Diagnosis not present

## 2018-02-18 DIAGNOSIS — I10 Essential (primary) hypertension: Secondary | ICD-10-CM

## 2018-02-18 DIAGNOSIS — L989 Disorder of the skin and subcutaneous tissue, unspecified: Secondary | ICD-10-CM

## 2018-02-18 DIAGNOSIS — E782 Mixed hyperlipidemia: Secondary | ICD-10-CM

## 2018-02-18 DIAGNOSIS — R7309 Other abnormal glucose: Secondary | ICD-10-CM

## 2018-02-18 DIAGNOSIS — Z23 Encounter for immunization: Secondary | ICD-10-CM | POA: Diagnosis not present

## 2018-02-18 MED ORDER — ROSUVASTATIN CALCIUM 10 MG PO TABS: 10.0000 mg | ORAL_TABLET | Freq: Every day | ORAL | 1 refills | Status: DC

## 2018-02-18 NOTE — Assessment & Plan Note (Signed)
Persistent mild elevated BP. - Home BP readings reported normal SBP [phone]  No known complications   Plan:  1. Continue current BP regimen - HCTZ 25mg  daily, Losartan 100mg , Amlodipine 5mg  daily - may do trial STOP Potassium 64mEq 2. Encourage improved lifestyle - low sodium diet, regular exercise 3. Continue monitor BP outside office, bring readings to next visit, if persistently >140/90 or new symptoms notify office sooner 4. Follow-up 6 month

## 2018-02-18 NOTE — Assessment & Plan Note (Signed)
Uncontrolled cholesterol on LDL >170, poor lifestyle Last lipid panel 01/2018  Plan: 1. START Statin Rosuvastatin 10mg  nightly - reviewed ASCVD risk and statin side effects - STOP Red Yeast Rice - Continue Omega 3 2. Encourage improved lifestyle - low carb/cholesterol, reduce portion size, continue improving regular exercise

## 2018-02-18 NOTE — Patient Instructions (Addendum)
Thank you for coming to the office today.  Elevated sugar, A1c 5.7 - try to reduce portions of bread and starches.  STOP Red Yeast Rice pill once start new one  You are at increased risk of future Cardiovascular complications such as Heart Attack or Stroke from an artery blockage due to abnormal cholesterol and/or risk factors. - As discussed, Statin Cholesterol pills both can both LOWER cholesterol and REDUCE this future risk of heart attack and stroke - Start Rosuvastatin (generic Crestor) 10mg  pill once at bedtime every night  If you develop mild aches or pains in muscle or joint that does NOT improve or go away after first 3-4 weeks then this may require [place] to adjust the dose. First I would recommend STOPPING the medication for a few weeks until your ache and pain symptoms completely RESOLVE. Then you can restart at a LOWER DOSE either HALF a pill at bedtime every night or LESS OFTEN such as one pill a week only and then gradually increase to every other day or max dose of 3 times a week  Lastly, sometimes we need to try other versions of this medicine to find one that works for you and does not cause side effects.  ---------------  Potassium is normal.  Stop taking Potassium supplement  Stay tuned for Derm apt  [facility]   [place], [place] [address] Hours: 8AM-5PM Phone: [phone]  -----------------------------  May try wrist splint for wrist. Possibly carpal tunnel.  Please schedule a Follow-up Appointment to: Return in about 6 months (around 08/19/2018) for 6 months for PreDM A1c,  HTN, Cholesterol med.  If you have any other questions or concerns, please feel free to call the office or send a message through [place]. You may also schedule an earlier appointment if necessary.  Additionally, you may be receiving a survey about your experience at our office within a few days to 1 week by e-mail or mail. We value your feedback.  Nobie Putnam, DO [place]

## 2018-02-18 NOTE — Assessment & Plan Note (Signed)
Elevated A1c 5.7, no prior readings Concern with HTN, HLD  Plan:  1. Not on any therapy currently  2. Encourage improved lifestyle - low carb, low sugar diet, reduce portion size, continue improving regular exercise - handout on glycemic index 3. Follow-up 6 months PreDM A1c

## 2018-02-18 NOTE — Progress Notes (Signed)
Subjective:    Patient ID: Heidi Bell, female    DOB: 1950-12-14, 67 y.o.   MRN: [id number]  Heidi Bell is a 67 y.o. female presenting on 02/18/2018 for Annual Exam   HPI   Here for Annual Physical and Lab Review.  Elevated A1c Recent lab show A1c 5.7, no prior readings. No history of PreDM or DM Not checking Sugar Never on mecdication  HYPERLIPIDEMIA: / Overweight BMI >27 - Reports no concerns. Last lipid panel 01/2018, elevated total chol and LDL 173. - Currently taking Red Yeast Rice and Fish Oil Omega 3, tolerating well without side effects or myalgias Lifestyle Down 2 lbs - Diet: balanced diet but not following low carb, interested in diet advice  CHRONIC HTN: Home BP normal. Last lab K was normal. Current Meds - HCTZ 25mg  daily, Losartan 100mg , Amlodipine 5mg  daily - Also takes Potassium 89mEq daily - asking if can stop Reports good compliance, took meds today. Tolerating well, w/o complaints.   Health Maintenance: Due for Flu Shot, will receive today    Depression screen [facility] 2/9 02/18/2018 11/21/2017 08/22/2017  Decreased Interest 0 0 0  Down, Depressed, Hopeless 0 0 0  PHQ - 2 Score 0 0 0  Altered sleeping - 1 0  Tired, decreased energy - 0 0  Change in appetite - 0 0  Feeling bad or failure about yourself  - 1 0  Trouble concentrating - 0 0  Moving slowly or fidgety/restless - 1 0  Suicidal thoughts - 0 0  PHQ-9 Score - 3 0  Difficult doing work/chores - Not difficult at all Not difficult at all    Past Medical History:  Diagnosis Date  . Anxiety   . Hyperlipidemia   . Hypertension    Past Surgical History:  Procedure Laterality Date  . ABDOMINAL HYSTERECTOMY    . boil removed  1984  . BREAST CYST ASPIRATION Left 1994   Dr. Bary Castilla  . COLONOSCOPY WITH PROPOFOL N/A 01/12/2015   Procedure: COLONOSCOPY WITH PROPOFOL;  Surgeon: Christene Lye, MD;  Location: [facility] ENDOSCOPY;  Service: Endoscopy;  Laterality: N/A;   Social History    Socioeconomic History  . Marital status: Single    Spouse name: Not on file  . Number of children: Not on file  . Years of education: Not on file  . Highest education level: Not on file  Occupational History  . Not on file  Social Needs  . Financial resource strain: Not hard at all  . Food insecurity:    Worry: Never true    Inability: Never true  . Transportation needs:    Medical: No    Non-medical: No  Tobacco Use  . Smoking status: Never Smoker  . Smokeless tobacco: Never Used  Substance and Sexual Activity  . Alcohol use: No    Alcohol/week: 0.0 standard drinks  . Drug use: No  . Sexual activity: Not on file  Lifestyle  . Physical activity:    Days per week: 0 days    Minutes per session: 0 min  . Stress: Not at all  Relationships  . Social connections:    Talks on phone: More than three times a week    Gets together: More than three times a week    Attends religious service: More than 4 times per year    Active member of club or organization: No    Attends meetings of clubs or organizations: Never    Relationship status: Never married  .  Intimate partner violence:    Fear of current or ex partner: No    Emotionally abused: No    Physically abused: No    Forced sexual activity: No  Other Topics Concern  . Not on file  Social History Narrative  . Not on file   Family History  Problem Relation Age of Onset  . Colon cancer Father   . COPD Mother   . Lung cancer Mother   . Bleeding Disorder Mother   . Breast cancer Sister    Current Outpatient Medications on File Prior to Visit  Medication Sig  . amLODipine (NORVASC) 5 MG tablet Take 1 tablet by mouth every day  . Ascorbic Acid (VITAMIN C) 500 MG CAPS Take 500 mg by mouth daily.  . Calcium Carb-Cholecalciferol (CALCIUM PLUS VITAMIN D3) 600-800 MG-UNIT TABS Take by mouth.  . Cholecalciferol (VITAMIN D3) 1000 UNITS CAPS Take by mouth daily.  . Garlic 1194 MG CAPS Take by mouth 2 (two) times daily.   Marland Kitchen  glucosamine-chondroitin 500-400 MG tablet Take 1 tablet by mouth 2 (two) times daily.  . hydrochlorothiazide (HYDRODIURIL) 25 MG tablet Take 1 tablet by mouth once daily  . losartan (COZAAR) 100 MG tablet Take 1 tablet by mouth daily  . Multiple Vitamins-Minerals (MULTIVITAMIN WITH MINERALS) tablet Take 1 tablet by mouth daily.  . Omega-3 Fatty Acids (FISH OIL) 1200 MG CAPS Take 1 capsule (1,200 mg total) by mouth 2 (two) times daily.  . potassium chloride (K-DUR,KLOR-CON) 10 MEQ tablet Take 2 tablets (20 mEq total) by mouth daily.  . sertraline (ZOLOFT) 50 MG tablet Take 1 tablet (50 mg total) by mouth daily.   No current facility-administered medications on file prior to visit.     Review of Systems  Constitutional: Negative for activity change, appetite change, chills, diaphoresis, fatigue and fever.  HENT: Negative for congestion and hearing loss.   Eyes: Negative for visual disturbance.  Respiratory: Negative for apnea, cough, choking, chest tightness, shortness of breath and wheezing.   Cardiovascular: Negative for chest pain, palpitations and leg swelling.  Gastrointestinal: Negative for abdominal pain, anal bleeding, blood in stool, constipation, diarrhea, nausea and vomiting.  Endocrine: Negative for cold intolerance.  Genitourinary: Negative for difficulty urinating, dysuria, frequency and hematuria.  Musculoskeletal: Negative for arthralgias, back pain and neck pain.  Skin: Negative for rash.  Allergic/Immunologic: Negative for environmental allergies.  Neurological: Negative for dizziness, weakness, light-headedness, numbness and headaches.  Hematological: Negative for adenopathy.  Psychiatric/Behavioral: Negative for behavioral problems, dysphoric mood and sleep disturbance. The patient is not nervous/anxious.    Per HPI unless specifically indicated above     Objective:    BP (!) 148/58   Pulse 77   Temp 98.3 F (36.8 C) (Oral)   Resp 16   Ht 5\' 1"  (1.549 m)   Wt  147 lb 9.6 oz (67 kg)   BMI 27.89 kg/m   Wt Readings from Last 3 Encounters:  02/18/18 147 lb 9.6 oz (67 kg)  11/21/17 149 lb 9.6 oz (67.9 kg)  08/22/17 148 lb 12.8 oz (67.5 kg)    Physical Exam  Constitutional: She is oriented to person, place, and time. She appears well-developed and well-nourished. No distress.  Well-appearing, comfortable, cooperative  HENT:  Head: Normocephalic and atraumatic.  Mouth/Throat: Oropharynx is clear and moist.  Eyes: Pupils are equal, round, and reactive to light. Conjunctivae and EOM are normal. Right eye exhibits no discharge. Left eye exhibits no discharge.  Neck: Normal range of motion. Neck  supple. No thyromegaly present.  Cardiovascular: Normal rate, regular rhythm, normal heart sounds and intact distal pulses.  No murmur heard. Pulmonary/Chest: Effort normal and breath sounds normal. No respiratory distress. She has no wheezes. She has no rales.  Abdominal: Soft. Bowel sounds are normal. She exhibits no distension and no mass. There is no tenderness.  Musculoskeletal: Normal range of motion. She exhibits no edema or tenderness.  Upper / Lower Extremities: - Normal muscle tone, strength bilateral upper extremities 5/5, lower extremities 5/5  Lymphadenopathy:    She has no cervical adenopathy.  Neurological: She is alert and oriented to person, place, and time.  Distal sensation intact to light touch all extremities  Skin: Skin is warm and dry. No rash noted. She is not diaphoretic. No erythema.  L cheek 1.5 cm slightly raised pale apigmented lesion with vasculature, slightly pearly appearance, non tender  Psychiatric: She has a normal mood and affect. Her behavior is normal.  Well groomed, good eye contact, normal speech and thoughts. Anxious at baseline.  Nursing note and vitals reviewed.     Left Cheek lesion     Results for orders placed or performed in visit on 02/12/18  TSH  Result Value Ref Range   TSH 1.73 0.40 - 4.50 mIU/L    Hepatitis C antibody  Result Value Ref Range   Hepatitis C Ab NON-REACTIVE NON-REACTI   SIGNAL TO CUT-OFF 0.02 <1.00  Lipid panel  Result Value Ref Range   Cholesterol 264 (H) <200 mg/dL   HDL 69 >50 mg/dL   Triglycerides 101 <150 mg/dL   LDL Cholesterol (Calc) 173 (H) mg/dL (calc)   Total CHOL/HDL Ratio 3.8 <5.0 (calc)   Non-HDL Cholesterol (Calc) 195 (H) <130 mg/dL (calc)  COMPLETE METABOLIC PANEL WITH GFR  Result Value Ref Range   Glucose, Bld 93 65 - 99 mg/dL   BUN 21 7 - 25 mg/dL   Creat 0.61 0.50 - 0.99 mg/dL   GFR, Est Non African American 92 > OR = 60 mL/min/1.28m2   GFR, Est African American 107 > OR = 60 mL/min/1.27m2   BUN/Creatinine Ratio NOT APPLICABLE 6 - 22 (calc)   Sodium 142 135 - 146 mmol/L   Potassium 4.0 3.5 - 5.3 mmol/L   Chloride 102 98 - 110 mmol/L   CO2 31 20 - 32 mmol/L   Calcium 10.4 8.6 - 10.4 mg/dL   Total Protein 7.3 6.1 - 8.1 g/dL   Albumin 4.5 3.6 - 5.1 g/dL   Globulin 2.8 1.9 - 3.7 g/dL (calc)   AG Ratio 1.6 1.0 - 2.5 (calc)   Total Bilirubin 0.9 0.2 - 1.2 mg/dL   Alkaline phosphatase (APISO) 73 33 - 130 U/L   AST 21 10 - 35 U/L   ALT 16 6 - 29 U/L  CBC with Differential/Platelet  Result Value Ref Range   WBC 7.0 3.8 - 10.8 Thousand/uL   RBC 4.37 3.80 - 5.10 Million/uL   Hemoglobin 13.3 11.7 - 15.5 g/dL   HCT 38.6 35.0 - 45.0 %   MCV 88.3 80.0 - 100.0 fL   MCH 30.4 27.0 - 33.0 pg   MCHC 34.5 32.0 - 36.0 g/dL   RDW 12.3 11.0 - 15.0 %   Platelets 224 140 - 400 Thousand/uL   MPV 11.5 7.5 - 12.5 fL   Neutro Abs 4,102 1,500 - 7,800 cells/uL   Lymphs Abs 1,988 850 - 3,900 cells/uL   WBC mixed population 658 200 - 950 cells/uL   Eosinophils Absolute 161  15 - 500 cells/uL   Basophils Absolute 91 0 - 200 cells/uL   Neutrophils Relative % 58.6 %   Total Lymphocyte 28.4 %   Monocytes Relative 9.4 %   Eosinophils Relative 2.3 %   Basophils Relative 1.3 %  Hemoglobin A1c  Result Value Ref Range   Hgb A1c MFr Bld 5.7 (H) <5.7 % of total Hgb    Mean Plasma Glucose 117 (calc)   eAG (mmol/L) 6.5 (calc)      Assessment & Plan:   Problem List Items Addressed This Visit    Elevated hemoglobin A1c    Elevated A1c 5.7, no prior readings Concern with HTN, HLD  Plan:  1. Not on any therapy currently  2. Encourage improved lifestyle - low carb, low sugar diet, reduce portion size, continue improving regular exercise - handout on glycemic index 3. Follow-up 6 months PreDM A1c      Hyperlipidemia    Uncontrolled cholesterol on LDL >170, poor lifestyle Last lipid panel 01/2018  Plan: 1. START Statin Rosuvastatin 10mg  nightly - reviewed ASCVD risk and statin side effects - STOP Red Yeast Rice - Continue Omega 3 2. Encourage improved lifestyle - low carb/cholesterol, reduce portion size, continue improving regular exercise      Relevant Medications   rosuvastatin (CRESTOR) 10 MG tablet   Hypertension    Persistent mild elevated BP. - Home BP readings reported normal SBP [phone]  No known complications   Plan:  1. Continue current BP regimen - HCTZ 25mg  daily, Losartan 100mg , Amlodipine 5mg  daily - may do trial STOP Potassium 48mEq 2. Encourage improved lifestyle - low sodium diet, regular exercise 3. Continue monitor BP outside office, bring readings to next visit, if persistently >140/90 or new symptoms notify office sooner 4. Follow-up 6 month      Relevant Medications   rosuvastatin (CRESTOR) 10 MG tablet    Other Visit Diagnoses    Annual physical exam    -  Primary   Needs flu shot       Relevant Orders   Flu vaccine HIGH DOSE PF (Completed)   Skin lesion of cheek       Relevant Orders   Ambulatory referral to Dermatology      Updated Health Maintenance information Reviewed recent lab results with patient Encouraged improvement to lifestyle with diet and exercise - Goal of weight loss   Meds ordered this encounter  Medications  . rosuvastatin (CRESTOR) 10 MG tablet    Sig: Take 1 tablet (10 mg  total) by mouth at bedtime.    Dispense:  90 tablet    Refill:  1    Orders Placed This Encounter  Procedures  . Flu vaccine HIGH DOSE PF  . Ambulatory referral to Dermatology    Referral Priority:   Routine    Referral Type:   Consultation    Referral Reason:   Specialty Services Required    Referred to Provider:   Brendolyn Patty, MD    Requested Specialty:   Dermatology    Number of Visits Requested:   1    Follow up plan: Return in about 6 months (around 08/19/2018) for 6 months for PreDM A1c,  HTN, Cholesterol med.  Nobie Putnam, DO [facility] 02/18/2018, 2:11 PM

## 2018-02-21 ENCOUNTER — Encounter: Payer: Self-pay | Admitting: Family Medicine

## 2018-02-21 ENCOUNTER — Ambulatory Visit (INDEPENDENT_AMBULATORY_CARE_PROVIDER_SITE_OTHER): Payer: PPO | Admitting: Family Medicine

## 2018-02-21 VITALS — BP 145/54 | HR 83 | Temp 98.4°F | Resp 16 | Ht 61.0 in | Wt 146.0 lb

## 2018-02-21 DIAGNOSIS — H6123 Impacted cerumen, bilateral: Secondary | ICD-10-CM | POA: Diagnosis not present

## 2018-02-21 NOTE — Patient Instructions (Addendum)
Thank you for coming to the office today.  You have thick impacted ear wax (cerumen) blocking ear canals and ear drums. This is the most likely cause of reduced hearing and ear pain and discomfort. - We were able to remove almost all of the ear wax with flushing in the office today  Recommend OTC Debrox in future.  Avoid using Q-tips inside ears, as this can push wax deeper, but you can try to use rolled up kleenex as a wick to absorb fluid and wax as well.   Please schedule a Follow-up Appointment to: Return if symptoms worsen or fail to improve, for cerumen.  If you have any other questions or concerns, please feel free to call the office or send a message through [place]. You may also schedule an earlier appointment if necessary.  Additionally, you may be receiving a survey about your experience at our office within a few days to 1 week by e-mail or mail. We value your feedback.  Heidi Putnam, DO [place]

## 2018-02-21 NOTE — Progress Notes (Signed)
Subjective:    Patient ID: JOELYNN DUST, female    DOB: November 18, 1950, 67 y.o.   MRN: [id number]  VALETTA MULROY is a 67 y.o. female presenting on 02/21/2018 for Cerumen Impaction (left side)  Accompanied by her sister, [name], who provides additional history.  HPI  Bilateral cerumen impaction, reduced hearing Reports issue with reduced hearing bilateral attributed to ear wax. Feels fullness within ears as well, last visit few days ago identified cerumen. She has not used any OTC drops. - Requesting ear lavage today - Admits reduced hearing L > R - Denies any fevers, chills, sinus pain or sinus congestion, ear pain   Depression screen [facility] 2/9 02/21/2018 02/18/2018 11/21/2017  Decreased Interest 0 0 0  Down, Depressed, Hopeless 0 0 0  PHQ - 2 Score 0 0 0  Altered sleeping - - 1  Tired, decreased energy - - 0  Change in appetite - - 0  Feeling bad or failure about yourself  - - 1  Trouble concentrating - - 0  Moving slowly or fidgety/restless - - 1  Suicidal thoughts - - 0  PHQ-9 Score - - 3  Difficult doing work/chores - - Not difficult at all    Social History   Tobacco Use  . Smoking status: Never Smoker  . Smokeless tobacco: Never Used  Substance Use Topics  . Alcohol use: No    Alcohol/week: 0.0 standard drinks  . Drug use: No    Review of Systems Per HPI unless specifically indicated above     Objective:    BP (!) 145/54   Pulse 83   Temp 98.4 F (36.9 C) (Oral)   Resp 16   Ht 5\' 1"  (1.549 m)   Wt 146 lb (66.2 kg)   BMI 27.59 kg/m   Wt Readings from Last 3 Encounters:  02/21/18 146 lb (66.2 kg)  02/18/18 147 lb 9.6 oz (67 kg)  11/21/17 149 lb 9.6 oz (67.9 kg)    Physical Exam  Constitutional: She appears well-developed and well-nourished. No distress.  HENT:  Head: Normocephalic and atraumatic.  Mouth/Throat: Oropharynx is clear and moist.  Bilateral TM with cerumen impaction.  Frontal / maxillary sinus and external ear mastoid non tender.   Eyes: Conjunctivae are normal. Right eye exhibits no discharge. Left eye exhibits no discharge.  Cardiovascular: Normal rate.  Pulmonary/Chest: Effort normal.  Skin: Skin is warm and dry. She is not diaphoretic.  Psychiatric: Her behavior is normal.  Nursing note and vitals reviewed.  ________________________________________________________ PROCEDURE NOTE Date: 02/21/18 Bilateral Ear Lavage / Cerumen Removal Discussed benefits and risks (including pain / discomforts, dizziness, minor abrasion of ear canal). Verbal consent given by patient. Medication:  carbamide peroxide ear drops, Ear Lavage Solution (warm water + hydrogen peroxide) Performed by Frederich Cha CMA and Dr Parks Ranger Several drops of carbamide peroxide placed in each ear, allowed to sit for few minutes. Ear lavage solution flushed into one ear at a time in attempt to dislodge and remove ear wax. Results were successful with removal of cerumen. Tolerated well.  Repeat Ear Exam: - Completely removed cerumen now, with clear ear canals and visible TMs clear and normal appearance. There is a small area of superficial ear canal abrasion from area of impacted cerumen within L canal     Assessment & Plan:   Problem List Items Addressed This Visit    None    Visit Diagnoses    Bilateral impacted cerumen    -  Primary  Bilateral hearing loss due to cerumen impaction          Significant amount of large thick impacted cerumen bilaterally, suspected primary cause of current reduced bilateral hearing  - No prior hearing aids or hearing evaluation.  Plan: 1. Successful office ear lavage cerumen removal today, re-evaluated with clear ear canals and normal TMs 2. Counseled on avoiding Q-tips and may use Kleenex as wick, use OTC Debrox as needed 3. Follow-up as needed  No orders of the defined types were placed in this encounter.   Follow up plan: Return if symptoms worsen or fail to improve, for cerumen.  Nobie Putnam, DO [facility] 02/21/2018, 11:42 AM

## 2018-04-16 DIAGNOSIS — C44319 Basal cell carcinoma of skin of other parts of face: Secondary | ICD-10-CM | POA: Diagnosis not present

## 2018-04-16 DIAGNOSIS — D485 Neoplasm of uncertain behavior of skin: Secondary | ICD-10-CM | POA: Diagnosis not present

## 2018-04-16 DIAGNOSIS — L720 Epidermal cyst: Secondary | ICD-10-CM | POA: Diagnosis not present

## 2018-04-16 DIAGNOSIS — L821 Other seborrheic keratosis: Secondary | ICD-10-CM | POA: Diagnosis not present

## 2018-04-22 DIAGNOSIS — C44319 Basal cell carcinoma of skin of other parts of face: Secondary | ICD-10-CM | POA: Diagnosis not present

## 2018-04-22 DIAGNOSIS — C4491 Basal cell carcinoma of skin, unspecified: Secondary | ICD-10-CM

## 2018-04-22 HISTORY — DX: Basal cell carcinoma of skin, unspecified: C44.91

## 2018-04-28 DIAGNOSIS — L820 Inflamed seborrheic keratosis: Secondary | ICD-10-CM | POA: Diagnosis not present

## 2018-05-21 ENCOUNTER — Other Ambulatory Visit: Payer: Self-pay | Admitting: Family Medicine

## 2018-05-21 DIAGNOSIS — E782 Mixed hyperlipidemia: Secondary | ICD-10-CM

## 2018-05-21 DIAGNOSIS — F419 Anxiety disorder, unspecified: Secondary | ICD-10-CM

## 2018-05-21 MED ORDER — ROSUVASTATIN CALCIUM 10 MG PO TABS: 10.0000 mg | ORAL_TABLET | Freq: Every day | ORAL | 3 refills | Status: DC

## 2018-05-21 MED ORDER — SERTRALINE HCL 50 MG PO TABS: 50.0000 mg | ORAL_TABLET | Freq: Every day | ORAL | 3 refills | Status: DC

## 2018-05-21 NOTE — Addendum Note (Signed)
Addended by: Olin Hauser on: 05/21/2018 05:12 PM   Modules accepted: Orders

## 2018-06-16 DIAGNOSIS — L570 Actinic keratosis: Secondary | ICD-10-CM | POA: Diagnosis not present

## 2018-06-16 DIAGNOSIS — D225 Melanocytic nevi of trunk: Secondary | ICD-10-CM | POA: Diagnosis not present

## 2018-06-16 DIAGNOSIS — D229 Melanocytic nevi, unspecified: Secondary | ICD-10-CM | POA: Diagnosis not present

## 2018-06-16 DIAGNOSIS — Z1283 Encounter for screening for malignant neoplasm of skin: Secondary | ICD-10-CM | POA: Diagnosis not present

## 2018-06-16 DIAGNOSIS — L820 Inflamed seborrheic keratosis: Secondary | ICD-10-CM | POA: Diagnosis not present

## 2018-06-16 DIAGNOSIS — L578 Other skin changes due to chronic exposure to nonionizing radiation: Secondary | ICD-10-CM | POA: Diagnosis not present

## 2018-06-16 DIAGNOSIS — D1800 Hemangioma unspecified site: Secondary | ICD-10-CM | POA: Diagnosis not present

## 2018-06-16 DIAGNOSIS — D485 Neoplasm of uncertain behavior of skin: Secondary | ICD-10-CM | POA: Diagnosis not present

## 2018-06-16 DIAGNOSIS — Z85828 Personal history of other malignant neoplasm of skin: Secondary | ICD-10-CM | POA: Diagnosis not present

## 2018-06-16 DIAGNOSIS — D2230 Melanocytic nevi of unspecified part of face: Secondary | ICD-10-CM | POA: Diagnosis not present

## 2018-06-16 DIAGNOSIS — L720 Epidermal cyst: Secondary | ICD-10-CM | POA: Diagnosis not present

## 2018-06-16 DIAGNOSIS — L812 Freckles: Secondary | ICD-10-CM | POA: Diagnosis not present

## 2018-06-16 HISTORY — DX: Melanocytic nevi, unspecified: D22.9

## 2018-07-08 ENCOUNTER — Other Ambulatory Visit: Payer: Self-pay | Admitting: Family Medicine

## 2018-07-08 DIAGNOSIS — I10 Essential (primary) hypertension: Secondary | ICD-10-CM

## 2018-07-08 MED ORDER — HYDROCHLOROTHIAZIDE 25 MG PO TABS: 25.0000 mg | ORAL_TABLET | Freq: Every day | ORAL | 1 refills | Status: DC

## 2018-07-08 MED ORDER — AMLODIPINE BESYLATE 5 MG PO TABS: 5.0000 mg | ORAL_TABLET | Freq: Every day | ORAL | 1 refills | Status: DC

## 2018-07-30 ENCOUNTER — Telehealth: Payer: Self-pay

## 2018-07-30 NOTE — Telephone Encounter (Signed)
Rescheduled awv to 09/02/2018, sister wants mmse done

## 2018-08-01 ENCOUNTER — Other Ambulatory Visit: Payer: Self-pay

## 2018-08-05 ENCOUNTER — Ambulatory Visit: Payer: PPO | Admitting: Family Medicine

## 2018-08-05 ENCOUNTER — Ambulatory Visit: Payer: Self-pay

## 2018-08-19 DIAGNOSIS — L720 Epidermal cyst: Secondary | ICD-10-CM | POA: Diagnosis not present

## 2018-08-26 DIAGNOSIS — L720 Epidermal cyst: Secondary | ICD-10-CM | POA: Diagnosis not present

## 2018-09-02 ENCOUNTER — Telehealth: Payer: Self-pay

## 2018-09-02 ENCOUNTER — Other Ambulatory Visit: Payer: Self-pay

## 2018-09-02 ENCOUNTER — Ambulatory Visit (INDEPENDENT_AMBULATORY_CARE_PROVIDER_SITE_OTHER): Payer: PPO

## 2018-09-02 VITALS — BP 133/56 | HR 93 | Temp 98.3°F | Resp 16 | Ht 61.0 in | Wt 144.6 lb

## 2018-09-02 DIAGNOSIS — Z1239 Encounter for other screening for malignant neoplasm of breast: Secondary | ICD-10-CM

## 2018-09-02 DIAGNOSIS — Z0000 Encounter for general adult medical examination without abnormal findings: Secondary | ICD-10-CM

## 2018-09-02 NOTE — Progress Notes (Signed)
Subjective:   Heidi Bell is a 68 y.o. female who presents for Medicare Annual (Subsequent) preventive examination.  Review of Systems:   Cardiac Risk Factors include: advanced age (>36men, >70 women);hypertension;dyslipidemia     Objective:     Vitals: BP (!) 133/56 (BP Location: Right Arm, Patient Position: Sitting)   Pulse 93   Temp 98.3 F (36.8 C) (Oral)   Resp 16   Ht 5\' 1"  (1.549 m)   Wt 144 lb 9.6 oz (65.6 kg)   SpO2 99%   BMI 27.32 kg/m   Body mass index is 27.32 kg/m.  Advanced Directives 09/02/2018 07/30/2017 01/12/2015  Does Patient Have a Medical Advance Directive? Yes Yes No  Type of Advance Directive Living will;Healthcare Power of Attorney Living will;Healthcare Power of Attorney -  [place] in Chart? Yes - validated most recent copy scanned in chart (See row information) No - copy requested -  Would patient like information on creating a medical advance directive? - - No - patient declined information    Tobacco Social History   Tobacco Use  Smoking Status Never Smoker  Smokeless Tobacco Never Used     Counseling given: Not Answered   Clinical Intake:  Pre-visit preparation completed: Yes  Pain : 0-10 Pain Score: 5  Pain Type: Chronic pain Pain Location: Hand Pain Orientation: Left, Right Pain Descriptors / Indicators: Aching Pain Onset: More than a month ago Pain Frequency: Constant     Nutritional Status: BMI 25 -29 Overweight Nutritional Risks: None Diabetes: No  How often do you need to have someone help you when you read instructions, pamphlets, or other written materials from your doctor or pharmacy?: 1 - Never What is the last grade level you completed in school?: 12th grade  Interpreter Needed?: No  Information entered by ::  ,LPN   Past Medical History:  Diagnosis Date  . Anxiety   . Hyperlipidemia   . Hypertension    Past Surgical History:  Procedure Laterality Date  .  ABDOMINAL HYSTERECTOMY    . boil removed  1984  . BREAST CYST ASPIRATION Left 1994   Dr. Bary Castilla  . COLONOSCOPY WITH PROPOFOL N/A 01/12/2015   Procedure: COLONOSCOPY WITH PROPOFOL;  Surgeon: Christene Lye, MD;  Location: [facility] ENDOSCOPY;  Service: Endoscopy;  Laterality: N/A;  . SKIN CANCER EXCISION     left shoulder, mid back and left cheek.    Family History  Problem Relation Age of Onset  . Colon cancer Father   . COPD Mother   . Lung cancer Mother   . Bleeding Disorder Mother   . Breast cancer Sister    Social History   Socioeconomic History  . Marital status: Single    Spouse name: Not on file  . Number of children: Not on file  . Years of education: Not on file  . Highest education level: High school graduate  Occupational History  . Occupation: retired  Scientific laboratory technician  . Financial resource strain: Not hard at all  . Food insecurity:    Worry: Never true    Inability: Never true  . Transportation needs:    Medical: No    Non-medical: No  Tobacco Use  . Smoking status: Never Smoker  . Smokeless tobacco: Never Used  Substance and Sexual Activity  . Alcohol use: No    Alcohol/week: 0.0 standard drinks  . Drug use: No  . Sexual activity: Not on file  Lifestyle  . Physical activity:  Days per week: 0 days    Minutes per session: 0 min  . Stress: Not at all  Relationships  . Social connections:    Talks on phone: More than three times a week    Gets together: More than three times a week    Attends religious service: More than 4 times per year    Active member of club or organization: No    Attends meetings of clubs or organizations: Never    Relationship status: Never married  Other Topics Concern  . Not on file  Social History Narrative  . Not on file    Outpatient Encounter Medications as of 09/02/2018  Medication Sig  . amLODipine (NORVASC) 5 MG tablet Take 1 tablet (5 mg total) by mouth daily.  . Ascorbic Acid (VITAMIN C) 500 MG CAPS Take 500  mg by mouth daily.  . Calcium Carb-Cholecalciferol (CALCIUM PLUS VITAMIN D3) 600-800 MG-UNIT TABS Take by mouth.  . Garlic 0258 MG CAPS Take by mouth 2 (two) times daily.   Marland Kitchen glucosamine-chondroitin 500-400 MG tablet Take 1 tablet by mouth 2 (two) times daily.  . hydrochlorothiazide (HYDRODIURIL) 25 MG tablet Take 1 tablet (25 mg total) by mouth daily.  Marland Kitchen losartan (COZAAR) 100 MG tablet Take 1 tablet by mouth daily  . Multiple Vitamins-Minerals (MULTIVITAMIN WITH MINERALS) tablet Take 1 tablet by mouth daily.  . Omega-3 Fatty Acids (FISH OIL) 1200 MG CAPS Take 1 capsule (1,200 mg total) by mouth 2 (two) times daily.  . potassium chloride (K-DUR,KLOR-CON) 10 MEQ tablet Take 2 tablets (20 mEq total) by mouth daily.  . rosuvastatin (CRESTOR) 10 MG tablet Take 1 tablet (10 mg total) by mouth at bedtime.  . sertraline (ZOLOFT) 50 MG tablet Take 1 tablet (50 mg total) by mouth daily.  . [DISCONTINUED] Cholecalciferol (VITAMIN D3) 1000 UNITS CAPS Take by mouth daily.   No facility-administered encounter medications on file as of 09/02/2018.     Activities of Daily Living In your present state of health, do you have any difficulty performing the following activities: 09/02/2018  Hearing? N  Vision? N  Difficulty concentrating or making decisions? N  Walking or climbing stairs? N  Dressing or bathing? N  Doing errands, shopping? Y  Comment sister assists  Preparing Food and eating ? N  Using the Toilet? N  In the past six months, have you accidently leaked urine? N  Do you have problems with loss of bowel control? N  Managing your Medications? N  Managing your Finances? Y  Comment sister does   Runner, broadcasting/film/video? N  Some recent data might be hidden    Patient Care Team: Olin Hauser, DO as PCP - General (Family Medicine) Bary Castilla, Forest Gleason, MD as Consulting Physician (General Surgery) Albina Billet, MD as Referring Physician (Internal Medicine)     Assessment:   This is a routine wellness examination for Elisama.  Exercise Activities and Dietary recommendations Current Exercise Habits: The patient does not participate in regular exercise at present, Exercise limited by: None identified  Goals    . DIET - INCREASE WATER INTAKE     Recommend drinking at least 6-8 glasses of water a day        Fall Risk: Fall Risk  09/02/2018 02/21/2018 02/18/2018 07/30/2017 02/26/2017  Falls in the past year? 0 0 0 No No  Follow up - Falls evaluation completed Falls evaluation completed - -    FALL RISK PREVENTION PERTAINING TO THE HOME:  Any stairs in or around the home? Yes  If so, are there any without handrails? No   Home free of loose throw rugs in walkways, pet beds, electrical cords, etc? Yes  Adequate lighting in your home to reduce risk of falls? Yes   ASSISTIVE DEVICES UTILIZED TO PREVENT FALLS:  Life alert? No  Use of a cane, walker or w/c? No  Grab bars in the bathroom? No  Shower chair or bench in shower? No  Elevated toilet seat or a handicapped toilet? No   DME ORDERS:  DME order needed?  No   TIMED UP AND GO:  Was the test performed? Yes .  Length of time to ambulate 10 feet: 10 sec.   GAIT:  Appearance of gait: Gait steady and fast without the use of an assistive device.  Education: Fall risk prevention has been discussed.  Intervention(s) required? No   DME/home health order needed?  No    Depression Screen PHQ 2/9 Scores 09/02/2018 02/21/2018 02/18/2018 11/21/2017  PHQ - 2 Score 1 0 0 0  PHQ- 9 Score - - - 3     Cognitive Function MMSE - Mini Mental State Exam 09/02/2018  Orientation to time 5  Orientation to Place 5  Registration 3  Attention/ Calculation 5  Recall 2  Language- name 2 objects 2  Language- repeat 1  Language- follow 3 step command 3  Language- read & follow direction 1  Write a sentence 1  Copy design 1  Total score 29     6CIT Screen 07/30/2017  What Year? 0 points  What  month? 0 points  What time? 0 points  Count back from 20 0 points  Months in reverse 0 points  Repeat phrase 2 points  Total Score 2    Immunization History  Administered Date(s) Administered  . Influenza, High Dose Seasonal PF 02/18/2018    Qualifies for Shingles Vaccine? Yes  Zostavax completed n/a. Due for Shingrix. Education has been provided regarding the importance of this vaccine. Pt has been advised to call insurance company to determine out of pocket expense. Advised may also receive vaccine at local pharmacy or Health Dept. Verbalized acceptance and understanding.  Tdap: Although this vaccine is not a covered service during a Wellness Exam, does the patient still wish to receive this vaccine today?  No .  Education has been provided regarding the importance of this vaccine. Advised may receive this vaccine at local pharmacy or Health Dept. Aware to provide a copy of the vaccination record if obtained from local pharmacy or Health Dept. Verbalized acceptance and understanding.  Flu Vaccine: up to date   Pneumococcal Vaccine: Due for Pneumococcal vaccine. Does the patient want to receive this vaccine today?  No . Education has been provided regarding the importance of this vaccine but still declined. Advised may receive this vaccine at local pharmacy or Health Dept. Aware to provide a copy of the vaccination record if obtained from local pharmacy or Health Dept. Verbalized acceptance and understanding.   Screening Tests Health Maintenance  Topic Date Due  . PNA vac Low Risk Adult (1 of 2 - PCV13) 05/05/2013  . TETANUS/TDAP  02/19/2019 (Originally 05/06/1969)  . INFLUENZA VACCINE  11/01/2018  . MAMMOGRAM  10/31/2019  . COLONOSCOPY  01/11/2025  . DEXA SCAN  Completed  . Hepatitis C Screening  Completed    Cancer Screenings:  Colorectal Screening: Completed 01/12/2015. Repeat every 10 years  Mammogram: Completed 10/30/2017. Repeat every year;   Bone  Density: Completed  10/30/2017.   Lung Cancer Screening: (Low Dose CT Chest recommended if Age 75-80 years, 30 pack-year currently smoking OR have quit w/in 15years.) does not qualify.     Additional Screening:  Hepatitis C Screening: does qualify; Completed 02/12/2018  Vision Screening: Recommended annual ophthalmology exams for early detection of glaucoma and other disorders of the eye. Is the patient up to date with their annual eye exam?  Yes  Who is the provider or what is the name of the office in which the pt attends annual eye exams? Dr.Woodard   Dental Screening: Recommended annual dental exams for proper oral hygiene  Community Resource Referral:  CRR required this visit?  No       Plan:  I have personally reviewed and addressed the Medicare Annual Wellness questionnaire and have noted the following in the patient's chart:  A. Medical and social history B. Use of alcohol, tobacco or illicit drugs  C. Current medications and supplements D. Functional ability and status E.  Nutritional status F.  Physical activity G. Advance directives H. List of other physicians I.  Hospitalizations, surgeries, and ER visits in previous 12 months J.  [place] such as hearing and vision if needed, cognitive and depression L. Referrals and appointments   In addition, I have reviewed and discussed with patient certain preventive protocols, quality metrics, and best practice recommendations. A written personalized care plan for preventive services as well as general preventive health recommendations were provided to patient. Nurse Health Advisor  Signed,    [place], Caro Hight, [place]  05/07/1898 Nurse Health Advisor   Nurse Notes: none

## 2018-09-02 NOTE — Telephone Encounter (Signed)
Called to confirm AWV appt in office. Patient confirmed.

## 2018-09-02 NOTE — Patient Instructions (Addendum)
Heidi Bell , Thank you for taking time to come for your Medicare Wellness Visit. I appreciate your ongoing commitment to your health goals. Please review the following plan we discussed and let me know if I can assist you in the future.   Screening recommendations/referrals: Colonoscopy: completed 01/12/2015 Mammogram: completed 10/30/2017 Please call [phone] to schedule your mammogram.  Bone Density: completed 10/30/2017 Recommended yearly ophthalmology/optometry visit for glaucoma screening and checkup Recommended yearly dental visit for hygiene and checkup  Vaccinations: Influenza vaccine: Due 12/2018 Pneumococcal vaccine: declined Tdap vaccine: due, check with your insurance company for coverage  Shingles vaccine: shingrix eligible, check with your insurance company for coverage   Advanced directives: copy on file   Conditions/risks identified: Recommend drinking at least 6-8 glasses of water a day   Next appointment: Follow up in one year for your annual wellness exam.    Preventive Care 65 Years and Older, Female Preventive care refers to lifestyle choices and visits with your health care provider that can promote health and wellness. What does preventive care include?  A yearly physical exam. This is also called an annual well check.  Dental exams once or twice a year.  Routine eye exams. Ask your health care provider how often you should have your eyes checked.  Personal lifestyle choices, including:  Daily care of your teeth and gums.  Regular physical activity.  Eating a healthy diet.  Avoiding tobacco and drug use.  Limiting alcohol use.  Practicing safe sex.  Taking low-dose aspirin every day.  Taking vitamin and mineral supplements as recommended by your health care provider. What happens during an annual well check? The services and screenings done by your health care provider during your annual well check will depend on your age, overall health,  lifestyle risk factors, and family history of disease. Counseling  Your health care provider may ask you questions about your:  Alcohol use.  Tobacco use.  Drug use.  Emotional well-being.  Home and relationship well-being.  Sexual activity.  Eating habits.  History of falls.  Memory and ability to understand (cognition).  Work and work Statistician.  Reproductive health. Screening  You may have the following tests or measurements:  Height, weight, and BMI.  Blood pressure.  Lipid and cholesterol levels. These may be checked every 5 years, or more frequently if you are over 25 years old.  Skin check.  Lung cancer screening. You may have this screening every year starting at age 56 if you have a 30-pack-year history of smoking and currently smoke or have quit within the past 15 years.  Fecal occult blood test (FOBT) of the stool. You may have this test every year starting at age 64.  Flexible sigmoidoscopy or colonoscopy. You may have a sigmoidoscopy every 5 years or a colonoscopy every 10 years starting at age 26.  Hepatitis C blood test.  Hepatitis B blood test.  Sexually transmitted disease (STD) testing.  Diabetes screening. This is done by checking your blood sugar (glucose) after you have not eaten for a while (fasting). You may have this done every 1-3 years.  Bone density scan. This is done to screen for osteoporosis. You may have this done starting at age 31.  Mammogram. This may be done every 1-2 years. Talk to your health care provider about how often you should have regular mammograms. Talk with your health care provider about your test results, treatment options, and if necessary, the need for more tests. Vaccines  Your health care  provider may recommend certain vaccines, such as:  Influenza vaccine. This is recommended every year.  Tetanus, diphtheria, and acellular pertussis (Tdap, Td) vaccine. You may need a Td booster every 10 years.  Zoster  vaccine. You may need this after age 67.  Pneumococcal 13-valent conjugate (PCV13) vaccine. One dose is recommended after age 17.  Pneumococcal polysaccharide (PPSV23) vaccine. One dose is recommended after age 67. Talk to your health care provider about which screenings and vaccines you need and how often you need them. This information is not intended to replace advice given to you by your health care provider. Make sure you discuss any questions you have with your health care provider. Document Released: 04/15/2015 Document Revised: 12/07/2015 Document Reviewed: 01/18/2015 Elsevier Interactive Patient Education  2017 [place] Prevention in the Home Falls can cause injuries. They can happen to people of all ages. There are many things you can do to make your home safe and to help prevent falls. What can I do on the outside of my home?  Regularly fix the edges of walkways and driveways and fix any cracks.  Remove anything that might make you trip as you walk through a door, such as a raised step or threshold.  Trim any bushes or trees on the path to your home.  Use bright outdoor lighting.  Clear any walking paths of anything that might make someone trip, such as rocks or tools.  Regularly check to see if handrails are loose or broken. Make sure that both sides of any steps have handrails.  Any raised decks and porches should have guardrails on the edges.  Have any leaves, snow, or ice cleared regularly.  Use sand or salt on walking paths during winter.  Clean up any spills in your garage right away. This includes oil or grease spills. What can I do in the bathroom?  Use night lights.  Install grab bars by the toilet and in the tub and shower. Do not use towel bars as grab bars.  Use non-skid mats or decals in the tub or shower.  If you need to sit down in the shower, use a plastic, non-slip stool.  Keep the floor dry. Clean up any water that spills on the  floor as soon as it happens.  Remove soap buildup in the tub or shower regularly.  Attach bath mats securely with double-sided non-slip rug tape.  Do not have throw rugs and other things on the floor that can make you trip. What can I do in the bedroom?  Use night lights.  Make sure that you have a light by your bed that is easy to reach.  Do not use any sheets or blankets that are too big for your bed. They should not hang down onto the floor.  Have a firm chair that has side arms. You can use this for support while you get dressed.  Do not have throw rugs and other things on the floor that can make you trip. What can I do in the kitchen?  Clean up any spills right away.  Avoid walking on wet floors.  Keep items that you use a lot in easy-to-reach places.  If you need to reach something above you, use a strong step stool that has a grab bar.  Keep electrical cords out of the way.  Do not use floor polish or wax that makes floors slippery. If you must use wax, use non-skid floor wax.  Do not have throw  rugs and other things on the floor that can make you trip. What can I do with my stairs?  Do not leave any items on the stairs.  Make sure that there are handrails on both sides of the stairs and use them. Fix handrails that are broken or loose. Make sure that handrails are as long as the stairways.  Check any carpeting to make sure that it is firmly attached to the stairs. Fix any carpet that is loose or worn.  Avoid having throw rugs at the top or bottom of the stairs. If you do have throw rugs, attach them to the floor with carpet tape.  Make sure that you have a light switch at the top of the stairs and the bottom of the stairs. If you do not have them, ask someone to add them for you. What else can I do to help prevent falls?  Wear shoes that:  Do not have high heels.  Have rubber bottoms.  Are comfortable and fit you well.  Are closed at the toe. Do not wear  sandals.  If you use a stepladder:  Make sure that it is fully opened. Do not climb a closed stepladder.  Make sure that both sides of the stepladder are locked into place.  Ask someone to hold it for you, if possible.  Clearly mark and make sure that you can see:  Any grab bars or handrails.  First and last steps.  Where the edge of each step is.  Use tools that help you move around (mobility aids) if they are needed. These include:  Canes.  Walkers.  Scooters.  Crutches.  Turn on the lights when you go into a dark area. Replace any light bulbs as soon as they burn out.  Set up your furniture so you have a clear path. Avoid moving your furniture around.  If any of your floors are uneven, fix them.  If there are any pets around you, be aware of where they are.  Review your medicines with your doctor. Some medicines can make you feel dizzy. This can increase your chance of falling. Ask your doctor what other things that you can do to help prevent falls. This information is not intended to replace advice given to you by your health care provider. Make sure you discuss any questions you have with your health care provider. Document Released: 01/13/2009 Document Revised: 08/25/2015 Document Reviewed: 04/23/2014 Elsevier Interactive Patient Education  2017 [employer].

## 2018-09-05 ENCOUNTER — Other Ambulatory Visit: Payer: Self-pay | Admitting: Family Medicine

## 2018-09-05 DIAGNOSIS — I10 Essential (primary) hypertension: Secondary | ICD-10-CM

## 2018-09-05 MED ORDER — LOSARTAN POTASSIUM 100 MG PO TABS: 100.0000 mg | ORAL_TABLET | Freq: Every day | ORAL | 1 refills | Status: DC

## 2018-09-10 ENCOUNTER — Other Ambulatory Visit: Payer: Self-pay

## 2018-09-10 ENCOUNTER — Other Ambulatory Visit: Payer: PPO

## 2018-09-10 DIAGNOSIS — I10 Essential (primary) hypertension: Secondary | ICD-10-CM | POA: Diagnosis not present

## 2018-09-10 DIAGNOSIS — R7309 Other abnormal glucose: Secondary | ICD-10-CM

## 2018-09-10 DIAGNOSIS — E782 Mixed hyperlipidemia: Secondary | ICD-10-CM | POA: Diagnosis not present

## 2018-09-11 LAB — COMPLETE METABOLIC PANEL WITH GFR
AG Ratio: 1.9 (calc) (ref 1.0–2.5)
ALT: 16 U/L (ref 6–29)
AST: 21 U/L (ref 10–35)
Albumin: 4.4 g/dL (ref 3.6–5.1)
Alkaline phosphatase (APISO): 63 U/L (ref 37–153)
BUN: 16 mg/dL (ref 7–25)
CO2: 33 mmol/L — ABNORMAL HIGH (ref 20–32)
Calcium: 9.7 mg/dL (ref 8.6–10.4)
Chloride: 101 mmol/L (ref 98–110)
Creat: 0.62 mg/dL (ref 0.60–0.93)
GFR, Est African American: 106 mL/min/{1.73_m2} (ref >=60)
GFR, Est Non African American: 91 mL/min/{1.73_m2} (ref >=60)
Globulin: 2.3 g/dL (calc) (ref 1.9–3.7)
Glucose, Bld: 126 mg/dL — ABNORMAL HIGH (ref 65–99)
Potassium: 3.9 mmol/L (ref 3.5–5.3)
Sodium: 142 mmol/L (ref 135–146)
Total Bilirubin: 0.9 mg/dL (ref 0.2–1.2)
Total Protein: 6.7 g/dL (ref 6.1–8.1)

## 2018-09-11 LAB — HEMOGLOBIN A1C
Hgb A1c MFr Bld: 5.6 % of total Hgb (ref <5.7)
Mean Plasma Glucose: 114 (calc)
eAG (mmol/L): 6.3 (calc)

## 2018-09-11 LAB — LIPID PANEL
Cholesterol: 141 mg/dL (ref <200)
HDL: 73 mg/dL (ref >=50)
LDL Cholesterol (Calc): 44 mg/dL (calc)
Non-HDL Cholesterol (Calc): 68 mg/dL (calc) (ref <130)
Total CHOL/HDL Ratio: 1.9 (calc) (ref <5.0)
Triglycerides: 158 mg/dL — ABNORMAL HIGH (ref <150)

## 2018-09-16 ENCOUNTER — Ambulatory Visit (INDEPENDENT_AMBULATORY_CARE_PROVIDER_SITE_OTHER): Payer: PPO | Admitting: Family Medicine

## 2018-09-16 ENCOUNTER — Encounter: Payer: Self-pay | Admitting: Family Medicine

## 2018-09-16 ENCOUNTER — Other Ambulatory Visit: Payer: Self-pay

## 2018-09-16 ENCOUNTER — Other Ambulatory Visit: Payer: Self-pay | Admitting: Family Medicine

## 2018-09-16 VITALS — BP 136/54 | Ht 61.0 in | Wt 143.6 lb

## 2018-09-16 DIAGNOSIS — E663 Overweight: Secondary | ICD-10-CM

## 2018-09-16 DIAGNOSIS — R7309 Other abnormal glucose: Secondary | ICD-10-CM

## 2018-09-16 DIAGNOSIS — E782 Mixed hyperlipidemia: Secondary | ICD-10-CM

## 2018-09-16 DIAGNOSIS — Z0000 Encounter for general adult medical examination without abnormal findings: Secondary | ICD-10-CM

## 2018-09-16 DIAGNOSIS — I10 Essential (primary) hypertension: Secondary | ICD-10-CM | POA: Diagnosis not present

## 2018-09-16 DIAGNOSIS — E559 Vitamin D deficiency, unspecified: Secondary | ICD-10-CM

## 2018-09-16 NOTE — Assessment & Plan Note (Signed)
Improved HTN control - Home BP readings reported normal SBP [phone]  No known complications  OFF K supplement  Plan:  1. Continue current BP regimen - HCTZ 25mg  daily, Losartan 100mg , Amlodipine 5mg  daily 2. Encourage improved lifestyle - low sodium diet, regular exercise 3. Continue monitor BP outside office, bring readings to next visit, if persistently >140/90 or new symptoms notify office sooner 4. Follow-up 6 month for annual

## 2018-09-16 NOTE — Patient Instructions (Addendum)
Thank you for coming to the office today.  Recent Labs    02/12/18 0802 09/10/18 0956  HGBA1C 5.7* 5.6   Blood sugar is improved.  Remain OFF Potassium. Your lab was normal.  Keep on improving lifestyle and exercise for weight loss.  May take Vitamin B6 and Magtab for neuropathy and cramping if you need  ALSO muscle cramps - Try spoonful of yellow mustard to relieve leg cramps or try daily to prevent the problem - OTC natural option is Hyland's Leg Cramps (Dissolving tablet) take as needed for muscle cramps  ------------  Last visit 02/19/19 for Annual Physical - needs to be scheduled after that date for [employer]  DUE for FASTING BLOOD WORK (no food or drink after midnight before the lab appointment, only water or coffee without cream/sugar on the morning of)  SCHEDULE "Lab Only" visit in the morning at the clinic for lab draw in 6 MONTHS   - Make sure Lab Only appointment is at about 1 week before your next appointment, so that results will be available  For Lab Results, once available within 2-3 days of blood draw, you can can log in to MyChart online to view your results and a brief explanation. Also, we can discuss results at next follow-up visit.   Please schedule a Follow-up Appointment to: Return in about 5 months (around 02/16/2019) for Annual Physical.  If you have any other questions or concerns, please feel free to call the office or send a message through [place]. You may also schedule an earlier appointment if necessary.  Additionally, you may be receiving a survey about your experience at our office within a few days to 1 week by e-mail or mail. We value your feedback.  Nobie Putnam, DO [place]

## 2018-09-16 NOTE — Assessment & Plan Note (Signed)
Mild improvement from A1c 5.7 down to 5.6, with improvement Concern with HTN, HLD  Plan:  1. Not on any therapy currently  2. Encourage improved lifestyle - low carb, low sugar diet, reduce portion size, continue improving regular exercise 3. Follow-up 6 months annual

## 2018-09-16 NOTE — Progress Notes (Signed)
Virtual Visit via Telephone The purpose of this virtual visit is to provide medical care while limiting exposure to the novel coronavirus (COVID19) for both patient and office staff.  Consent was obtained for phone visit:  Yes.   Answered questions that patient had about telehealth interaction:  Yes.   I discussed the limitations, risks, security and privacy concerns of performing an evaluation and management service by telephone. I also discussed with the patient that there may be a patient responsible charge related to this service. The patient expressed understanding and agreed to proceed.  Patient Location: Home Provider Location: [facility])  ---------------------------------------------------------------------- Chief Complaint  Patient presents with  . PreDM  . Hypertension    S: Reviewed CMA documentation. I have called patient and family as well, Gaynelle, and gathered additional HPI as follows:  Elevated A1c Last lab 5.6, improved from 5.7 previously She attributes improvement to lifestyle changes. No history of PreDM or DM Not checking Sugar Never on medication  HYPERLIPIDEMIA: / Overweight BMI >27 Last visit 01/2018 she had elevated LDL >170, she was started on Statin therapy with rosuvastatin. Last lipid panel 09/2018 now shows interval significant improvement in reduced LDL down to 40s, and normal HDL and mild elevated TG - Currently taking Rosuvastatin 10mg  nightly , tolerating well without side effect or myalgia - OFF Red Yeast Rice - On Fish Oil omega 3 1,000 mg daily Lifestyle Exercise: Unable to go to gym for regular exercise. Limited  Weight relatively stable - Diet: balanced diet reduced carb and sugar intake  CHRONIC HTN: Last home BP reading 136/54, before medicine. Usually checking with normal readings by report Last visit 01/2018, she was taken off Potassium supplement, and lab shows 09/2018 normal K 3.9 Current Meds -HCTZ 25mg   daily, Losartan 100mg , Amlodipine 5mg  daily - OFF Potassium supplement Reports good compliance, took meds today. Tolerating well, w/o complaints. Denies CP, dyspnea, HA, edema, dizziness / lightheadedness  Additional complaint Hand Pain, worse cold weather, numbness in fingers, cramping - says family member takes Vitamin B6 wanted to know if she could take this as well. Also MagTab for muscle cramps.   Past Medical History:  Diagnosis Date  . Anxiety   . Hyperlipidemia   . Hypertension    Social History   Tobacco Use  . Smoking status: Never Smoker  . Smokeless tobacco: Never Used  Substance Use Topics  . Alcohol use: No    Alcohol/week: 0.0 standard drinks  . Drug use: No    Current Outpatient Medications:  .  amLODipine (NORVASC) 5 MG tablet, Take 1 tablet (5 mg total) by mouth daily., Disp: 90 tablet, Rfl: 1 .  Ascorbic Acid (VITAMIN C) 500 MG CAPS, Take 500 mg by mouth daily. (Patient taking differently: Take 1,000 mg by mouth daily. ), Disp: , Rfl:  .  Garlic 9371 MG CAPS, Take by mouth 2 (two) times daily. , Disp: , Rfl:  .  glucosamine-chondroitin 500-400 MG tablet, Take 1 tablet by mouth 2 (two) times daily. (Patient taking differently: Take 1 tablet by mouth 2 (two) times daily. ), Disp: , Rfl:  .  hydrochlorothiazide (HYDRODIURIL) 25 MG tablet, Take 1 tablet (25 mg total) by mouth daily., Disp: 90 tablet, Rfl: 1 .  losartan (COZAAR) 100 MG tablet, Take 1 tablet (100 mg total) by mouth daily., Disp: 90 tablet, Rfl: 1 .  Multiple Vitamins-Minerals (MULTIVITAMIN WITH MINERALS) tablet, Take 1 tablet by mouth daily., Disp: , Rfl:  .  Omega-3 Fatty Acids (  FISH OIL) 1200 MG CAPS, Take 1 capsule (1,200 mg total) by mouth 2 (two) times daily. (Patient taking differently: Take 1,200 mg by mouth 2 (two) times daily. ), Disp: , Rfl:  .  rosuvastatin (CRESTOR) 10 MG tablet, Take 1 tablet (10 mg total) by mouth at bedtime., Disp: 90 tablet, Rfl: 3 .  sertraline (ZOLOFT) 50 MG tablet,  Take 1 tablet (50 mg total) by mouth daily., Disp: 90 tablet, Rfl: 3 .  Calcium Carb-Cholecalciferol (CALCIUM PLUS VITAMIN D3) 600-800 MG-UNIT TABS, Take by mouth., Disp: , Rfl:   Depression screen [facility] 2/9 09/16/2018 09/02/2018 02/21/2018  Decreased Interest 0 0 0  Down, Depressed, Hopeless 0 1 0  PHQ - 2 Score 0 1 0  Altered sleeping - - -  Tired, decreased energy - - -  Change in appetite - - -  Feeling bad or failure about yourself  - - -  Trouble concentrating - - -  Moving slowly or fidgety/restless - - -  Suicidal thoughts - - -  PHQ-9 Score - - -  Difficult doing work/chores - - -    GAD 7 : Generalized Anxiety Score 09/16/2018 11/21/2017 08/22/2017  Nervous, Anxious, on Edge 1 2 0  Control/stop worrying 0 2 0  Worry too much - different things 0 1 0  Trouble relaxing 2 3 1   Restless 0 3 0  Easily annoyed or irritable 1 2 0  Afraid - awful might happen 1 2 0  Total GAD 7 Score 5 15 1   Anxiety Difficulty Not difficult at all Not difficult at all Not difficult at all    -------------------------------------------------------------------------- O: No physical exam performed due to remote telephone encounter.  BP (!) 136/54 (BP Location: Left Arm, Patient Position: Sitting, Cuff Size: Normal)   Ht 5\' 1"  (1.549 m)   Wt 143 lb 9.6 oz (65.1 kg)   BMI 27.13 kg/m   Lab results reviewed.  Recent Labs    02/12/18 0802 09/10/18 0956  HGBA1C 5.7* 5.6     Recent Results (from the past 2160 hour(s))  Hemoglobin A1c     Status: None   Collection Time: 09/10/18  9:56 AM  Result Value Ref Range   Hgb A1c MFr Bld 5.6 <5.7 % of total Hgb    Comment: For the purpose of screening for the presence of diabetes: . <5.7%       Consistent with the absence of diabetes 5.7-6.4%    Consistent with increased risk for diabetes             (prediabetes) > or =6.5%  Consistent with diabetes . This assay result is consistent with a decreased risk of diabetes. . Currently, no consensus  exists regarding use of hemoglobin A1c for diagnosis of diabetes in children. . According to American Diabetes Association (ADA) guidelines, hemoglobin A1c <7.0% represents optimal control in non-pregnant diabetic patients. Different metrics may apply to specific patient populations.  Standards of Medical Care in Diabetes(ADA). .    Mean Plasma Glucose 114 (calc)   eAG (mmol/L) 6.3 (calc)  Lipid panel     Status: Abnormal   Collection Time: 09/10/18  9:56 AM  Result Value Ref Range   Cholesterol 141 <200 mg/dL   HDL 73 > OR = 50 mg/dL   Triglycerides 158 (H) <150 mg/dL   LDL Cholesterol (Calc) 44 mg/dL (calc)    Comment: Reference range: <100 . Desirable range <100 mg/dL for primary prevention;   <70 mg/dL for patients with CHD or  diabetic patients  with > or = 2 CHD risk factors. Marland Kitchen LDL-C is now calculated using the Martin-Hopkins  calculation, which is a validated novel method providing  better accuracy than the Friedewald equation in the  estimation of LDL-C.  Cresenciano Genre et al. Annamaria Helling. 3546;568(12): 2061-2068  (http://education.[facility].com/faq/FAQ164)    Total CHOL/HDL Ratio 1.9 <5.0 (calc)   Non-HDL Cholesterol (Calc) 68 <130 mg/dL (calc)    Comment: For patients with diabetes plus 1 major ASCVD risk  factor, treating to a non-HDL-C goal of <100 mg/dL  (LDL-C of <70 mg/dL) is considered a therapeutic  option.   COMPLETE METABOLIC PANEL WITH GFR     Status: Abnormal   Collection Time: 09/10/18  9:56 AM  Result Value Ref Range   Glucose, Bld 126 (H) 65 - 99 mg/dL    Comment: .            Fasting reference interval . For someone without known diabetes, a glucose value >125 mg/dL indicates that they may have diabetes and this should be confirmed with a follow-up test. .    BUN 16 7 - 25 mg/dL   Creat 0.62 0.60 - 0.93 mg/dL    Comment: For patients >34 years of age, the reference limit for Creatinine is approximately 13% higher for people identified as  African-American. .    GFR, Est Non African American 91 > OR = 60 mL/min/1.63m2   GFR, Est African American 106 > OR = 60 mL/min/1.66m2   BUN/Creatinine Ratio NOT APPLICABLE 6 - 22 (calc)   Sodium 142 135 - 146 mmol/L   Potassium 3.9 3.5 - 5.3 mmol/L   Chloride 101 98 - 110 mmol/L   CO2 33 (H) 20 - 32 mmol/L   Calcium 9.7 8.6 - 10.4 mg/dL   Total Protein 6.7 6.1 - 8.1 g/dL   Albumin 4.4 3.6 - 5.1 g/dL   Globulin 2.3 1.9 - 3.7 g/dL (calc)   AG Ratio 1.9 1.0 - 2.5 (calc)   Total Bilirubin 0.9 0.2 - 1.2 mg/dL   Alkaline phosphatase (APISO) 63 37 - 153 U/L   AST 21 10 - 35 U/L   ALT 16 6 - 29 U/L    -------------------------------------------------------------------------- A&P:  Problem List Items Addressed This Visit    Elevated hemoglobin A1c - Primary    Mild improvement from A1c 5.7 down to 5.6, with improvement Concern with HTN, HLD  Plan:  1. Not on any therapy currently  2. Encourage improved lifestyle - low carb, low sugar diet, reduce portion size, continue improving regular exercise 3. Follow-up 6 months annual      Hyperlipidemia    Significantly improved cholesterol control now on statin, LDL 44, previously >170. Normal HDL and borderline elevated TG Last lipid panel 09/2018 OFF Red Yeast Rice  Plan: 1. Continue Rosuvastatin 10mg  nightly, excellent results - Continue Omega 3 1000mg  daily 2. Encourage improved lifestyle - low carb/cholesterol, reduce portion size, continue improving regular exercise  Recheck 6 months for annual      Hypertension    Improved HTN control - Home BP readings reported normal SBP 751-700  No known complications  OFF K supplement  Plan:  1. Continue current BP regimen - HCTZ 25mg  daily, Losartan 100mg , Amlodipine 5mg  daily 2. Encourage improved lifestyle - low sodium diet, regular exercise 3. Continue monitor BP outside office, bring readings to next visit, if persistently >140/90 or new symptoms notify office sooner 4.  Follow-up 6 month for annual  Overweight (BMI 25.0-29.9)    Overweight BMI >27 Improving lifestyle and weight Encourage keep improving diet exercise         No orders of the defined types were placed in this encounter.   Follow-up: - Return in 5 months for Annual Physical after 02/20/19 - Future labs ordered for 02/23/19  Patient verbalizes understanding with the above medical recommendations including the limitation of remote medical advice.  Specific follow-up and call-back criteria were given for patient to follow-up or seek medical care more urgently if needed.  - Time spent in direct consultation with patient on phone: 15 minutes  Nobie Putnam, [facility] 09/16/2018, 10:16 AM

## 2018-09-16 NOTE — Assessment & Plan Note (Signed)
Overweight BMI >27 Improving lifestyle and weight Encourage keep improving diet exercise

## 2018-09-16 NOTE — Assessment & Plan Note (Signed)
Significantly improved cholesterol control now on statin, LDL 44, previously >170. Normal HDL and borderline elevated TG Last lipid panel 09/2018 OFF Red Yeast Rice  Plan: 1. Continue Rosuvastatin 10mg  nightly, excellent results - Continue Omega 3 1000mg  daily 2. Encourage improved lifestyle - low carb/cholesterol, reduce portion size, continue improving regular exercise  Recheck 6 months for annual

## 2018-10-07 ENCOUNTER — Other Ambulatory Visit: Payer: Self-pay | Admitting: Family Medicine

## 2018-10-07 DIAGNOSIS — Z1231 Encounter for screening mammogram for malignant neoplasm of breast: Secondary | ICD-10-CM

## 2018-11-04 ENCOUNTER — Encounter: Payer: Self-pay | Admitting: General Surgery

## 2018-11-13 ENCOUNTER — Ambulatory Visit
Admission: RE | Admit: 2018-11-13 | Discharge: 2018-11-13 | Disposition: A | Discharge status: Home / Self Care | Payer: PPO | Source: Ambulatory Visit | Attending: Family Medicine | Admitting: Family Medicine

## 2018-11-13 DIAGNOSIS — Z1231 Encounter for screening mammogram for malignant neoplasm of breast: Secondary | ICD-10-CM | POA: Diagnosis not present

## 2019-01-15 ENCOUNTER — Other Ambulatory Visit: Payer: Self-pay | Admitting: Family Medicine

## 2019-01-15 DIAGNOSIS — I10 Essential (primary) hypertension: Secondary | ICD-10-CM

## 2019-01-15 MED ORDER — HYDROCHLOROTHIAZIDE 25 MG PO TABS: 25.0000 mg | ORAL_TABLET | Freq: Every day | ORAL | 1 refills | Status: DC

## 2019-01-15 MED ORDER — AMLODIPINE BESYLATE 5 MG PO TABS: 5.0000 mg | ORAL_TABLET | Freq: Every day | ORAL | 1 refills | Status: DC

## 2019-02-18 ENCOUNTER — Other Ambulatory Visit: Payer: Self-pay | Admitting: Family Medicine

## 2019-02-18 DIAGNOSIS — I10 Essential (primary) hypertension: Secondary | ICD-10-CM

## 2019-02-18 MED ORDER — LOSARTAN POTASSIUM 100 MG PO TABS: 100.0000 mg | ORAL_TABLET | Freq: Every day | ORAL | 1 refills | Status: DC

## 2019-02-23 ENCOUNTER — Other Ambulatory Visit: Payer: Self-pay

## 2019-02-23 ENCOUNTER — Other Ambulatory Visit: Payer: PPO

## 2019-02-23 DIAGNOSIS — I10 Essential (primary) hypertension: Secondary | ICD-10-CM | POA: Diagnosis not present

## 2019-02-23 DIAGNOSIS — E559 Vitamin D deficiency, unspecified: Secondary | ICD-10-CM | POA: Diagnosis not present

## 2019-02-23 DIAGNOSIS — Z0000 Encounter for general adult medical examination without abnormal findings: Secondary | ICD-10-CM | POA: Diagnosis not present

## 2019-02-23 DIAGNOSIS — R7309 Other abnormal glucose: Secondary | ICD-10-CM

## 2019-02-23 DIAGNOSIS — E782 Mixed hyperlipidemia: Secondary | ICD-10-CM

## 2019-02-24 LAB — CBC WITH DIFFERENTIAL/PLATELET
Absolute Monocytes: 651 cells/uL (ref 200–950)
Basophils Absolute: 63 cells/uL (ref 0–200)
Basophils Relative: 0.9 %
Eosinophils Absolute: 133 cells/uL (ref 15–500)
Eosinophils Relative: 1.9 %
HCT: 40 % (ref 35.0–45.0)
Hemoglobin: 13.3 g/dL (ref 11.7–15.5)
Lymphs Abs: 1694 cells/uL (ref 850–3900)
MCH: 30.1 pg (ref 27.0–33.0)
MCHC: 33.3 g/dL (ref 32.0–36.0)
MCV: 90.5 fL (ref 80.0–100.0)
MPV: 12 fL (ref 7.5–12.5)
Monocytes Relative: 9.3 %
Neutro Abs: 4459 cells/uL (ref 1500–7800)
Neutrophils Relative %: 63.7 %
Platelets: 186 10*3/uL (ref 140–400)
RBC: 4.42 10*6/uL (ref 3.80–5.10)
RDW: 12.3 % (ref 11.0–15.0)
Total Lymphocyte: 24.2 %
WBC: 7 10*3/uL (ref 3.8–10.8)

## 2019-02-24 LAB — VITAMIN D 25 HYDROXY (VIT D DEFICIENCY, FRACTURES): Vit D, 25-Hydroxy: 31 ng/mL (ref 30–100)

## 2019-02-24 LAB — COMPLETE METABOLIC PANEL WITH GFR
AG Ratio: 1.8 (calc) (ref 1.0–2.5)
ALT: 16 U/L (ref 6–29)
AST: 23 U/L (ref 10–35)
Albumin: 4.5 g/dL (ref 3.6–5.1)
Alkaline phosphatase (APISO): 68 U/L (ref 37–153)
BUN: 19 mg/dL (ref 7–25)
CO2: 28 mmol/L (ref 20–32)
Calcium: 9.9 mg/dL (ref 8.6–10.4)
Chloride: 103 mmol/L (ref 98–110)
Creat: 0.64 mg/dL (ref 0.60–0.93)
GFR, Est African American: 105 mL/min/{1.73_m2} (ref >=60)
GFR, Est Non African American: 90 mL/min/{1.73_m2} (ref >=60)
Globulin: 2.5 g/dL (calc) (ref 1.9–3.7)
Glucose, Bld: 89 mg/dL (ref 65–99)
Potassium: 3.8 mmol/L (ref 3.5–5.3)
Sodium: 143 mmol/L (ref 135–146)
Total Bilirubin: 0.8 mg/dL (ref 0.2–1.2)
Total Protein: 7 g/dL (ref 6.1–8.1)

## 2019-02-24 LAB — LIPID PANEL
Cholesterol: 159 mg/dL (ref <200)
HDL: 80 mg/dL (ref >=50)
LDL Cholesterol (Calc): 60 mg/dL (calc)
Non-HDL Cholesterol (Calc): 79 mg/dL (calc) (ref <130)
Total CHOL/HDL Ratio: 2 (calc) (ref <5.0)
Triglycerides: 102 mg/dL (ref <150)

## 2019-02-24 LAB — HEMOGLOBIN A1C
Hgb A1c MFr Bld: 5.8 % of total Hgb — ABNORMAL HIGH (ref <5.7)
Mean Plasma Glucose: 120 (calc)
eAG (mmol/L): 6.6 (calc)

## 2019-03-02 ENCOUNTER — Other Ambulatory Visit: Payer: Self-pay | Admitting: Family Medicine

## 2019-03-02 ENCOUNTER — Ambulatory Visit (INDEPENDENT_AMBULATORY_CARE_PROVIDER_SITE_OTHER): Payer: PPO | Admitting: Family Medicine

## 2019-03-02 ENCOUNTER — Encounter: Payer: Self-pay | Admitting: Family Medicine

## 2019-03-02 ENCOUNTER — Other Ambulatory Visit: Payer: Self-pay

## 2019-03-02 VITALS — BP 146/59 | Ht 61.0 in | Wt 147.0 lb

## 2019-03-02 DIAGNOSIS — I10 Essential (primary) hypertension: Secondary | ICD-10-CM | POA: Diagnosis not present

## 2019-03-02 DIAGNOSIS — E559 Vitamin D deficiency, unspecified: Secondary | ICD-10-CM

## 2019-03-02 DIAGNOSIS — E663 Overweight: Secondary | ICD-10-CM

## 2019-03-02 DIAGNOSIS — F419 Anxiety disorder, unspecified: Secondary | ICD-10-CM

## 2019-03-02 DIAGNOSIS — R7309 Other abnormal glucose: Secondary | ICD-10-CM

## 2019-03-02 DIAGNOSIS — E782 Mixed hyperlipidemia: Secondary | ICD-10-CM | POA: Diagnosis not present

## 2019-03-02 MED ORDER — SERTRALINE HCL 50 MG PO TABS: 50.0000 mg | ORAL_TABLET | Freq: Every day | ORAL | 3 refills | Status: DC

## 2019-03-02 NOTE — Assessment & Plan Note (Addendum)
Mild elevated BP reading by report, has just taken medications however No known complications  OFF K supplement  Plan:  1. Continue current BP regimen - HCTZ 25mg  daily, Losartan 100mg , Amlodipine 5mg  daily 2. Encourage improved lifestyle - low sodium diet, regular exercise 3. Continue monitor BP outside office, bring readings to next visit, if persistently >140/90 or new symptoms notify office sooner 4. Follow-up 6 months  May consider inc Amlodipine from 5 to 10 in future

## 2019-03-02 NOTE — Assessment & Plan Note (Signed)
Well controlled lipids now on statin Last lipid panel 02/2019 OFF Red Yeast Rice  Plan: 1. Continue Rosuvastatin 10mg  nightly - Continue Omega 3 1000mg  daily 2. Encourage improved lifestyle - low carb/cholesterol, reduce portion size, continue improving regular exercise

## 2019-03-02 NOTE — Assessment & Plan Note (Signed)
Overweight BMI >27 Improving lifestyle and weight Encourage keep improving diet exercise

## 2019-03-02 NOTE — Patient Instructions (Addendum)
Thank you for coming to the office today.  Recent Labs    09/10/18 0956 02/23/19 0837  HGBA1C 5.6 5.8*   Keep improving diet and reducing sugar.  Keep track of BP regularly and if persistent elevated can contact us and follow-up  Please schedule a Follow-up Appointment to: Return in about 6 months (around 08/30/2019) for 6 month follow-up.  If you have any other questions or concerns, please feel free to call the office or send a message through [place]. You may also schedule an earlier appointment if necessary.  Additionally, you may be receiving a survey about your experience at our office within a few days to 1 week by e-mail or mail. We value your feedback.  Nobie Putnam, DO [place]

## 2019-03-02 NOTE — Assessment & Plan Note (Signed)
Controlled Vit D def on supplement Lab reviewed

## 2019-03-02 NOTE — Assessment & Plan Note (Signed)
Mild increase A1c up to 5.8 now with some poor dietary habits lately by her report Concern with HTN, HLD  Plan:  1. Not on any therapy currently  2. Encourage improved lifestyle - low carb, low sugar diet, reduce portion size, continue improving regular exercise 3. Follow-up 6 months PreDM A1c

## 2019-03-02 NOTE — Progress Notes (Signed)
Virtual Visit via Telephone The purpose of this virtual visit is to provide medical care while limiting exposure to the novel coronavirus (COVID19) for both patient and office staff.  Consent was obtained for phone visit:  Yes.   Answered questions that patient had about telehealth interaction:  Yes.   I discussed the limitations, risks, security and privacy concerns of performing an evaluation and management service by telephone. I also discussed with the patient that there may be a patient responsible charge related to this service. The patient expressed understanding and agreed to proceed.  Patient Location: Home Provider Location: [facility])  ---------------------------------------------------------------------- Chief Complaint  Patient presents with  . Diabetes  . Hypertension    S: Reviewed CMA documentation. I have called patient and gathered additional HPI as follows: - Sister, Cyndra Numbers available as well.  Elevated A1c Last A1c up to 5.8 from previous, attributed to eating more sweets. She attributes improvement to lifestyle changes. No history of DM Not checking Sugar Never on medication  HYPERLIPIDEMIA:/ Overweight BMI >27 Last visit 01/2018 she had elevated LDL >170, she was started on Statin therapy with rosuvastatin. Last lipid panel6/2020 now shows interval significant improvement in reduced LDL down to 40s, and normal HDL and mild elevated TG - Currently taking Rosuvastatin 10mg  nightly , tolerating well without side effect or myalgia - OFF Red Yeast Rice - On Fish Oil omega 3 1,000 mg daily Lifestyle Exercise: Unable to go to gym for regular exercise. Limited  Weight relatively stable - Diet:balanced diet reduced carb and sugar intake  CHRONIC HTN: Last home BP reading 130-140s, before medicine. Usually checking with normal readings by report Current Meds -HCTZ 25mg  daily, Losartan 100mg , Amlodipine 5mg  daily - OFF Potassium  supplement Reports good compliance, took meds today. Tolerating well, w/o complaints. Denies CP, dyspnea, HA, edema, dizziness / lightheadedness  Vitamin D Deficiency Last lab was normal 31. Taking OTC Vitamin D  HYPERLIPIDEMIA: - Reports no concerns. Last lipid panel 02/2019, controlled on statin - Currently taking Rosuvastatin 10mg  nightly, tolerating well without side effects or myalgias   Denies any high risk travel to areas of current concern for COVID19. Denies any known or suspected exposure to person with or possibly with COVID19.  Denies any fevers, chills, sweats, body ache, cough, shortness of breath, sinus pain or pressure, headache, abdominal pain, diarrhea  Past Medical History:  Diagnosis Date  . Anxiety   . Hyperlipidemia   . Hypertension    Social History   Tobacco Use  . Smoking status: Never Smoker  . Smokeless tobacco: Never Used  Substance Use Topics  . Alcohol use: No    Alcohol/week: 0.0 standard drinks  . Drug use: No    Current Outpatient Medications:  .  amLODipine (NORVASC) 5 MG tablet, Take 1 tablet (5 mg total) by mouth daily., Disp: 90 tablet, Rfl: 1 .  Ascorbic Acid (VITAMIN C) 500 MG CAPS, Take 500 mg by mouth daily. (Patient taking differently: Take 1,000 mg by mouth daily. ), Disp: , Rfl:  .  Calcium Carb-Cholecalciferol (CALCIUM PLUS VITAMIN D3) 600-800 MG-UNIT TABS, Take by mouth., Disp: , Rfl:  .  Garlic 123XX123 MG CAPS, Take by mouth 2 (two) times daily. , Disp: , Rfl:  .  glucosamine-chondroitin 500-400 MG tablet, Take 1 tablet by mouth 2 (two) times daily. (Patient taking differently: Take 1 tablet by mouth 2 (two) times daily. ), Disp: , Rfl:  .  hydrochlorothiazide (HYDRODIURIL) 25 MG tablet, Take 1 tablet (25  mg total) by mouth daily., Disp: 90 tablet, Rfl: 1 .  losartan (COZAAR) 100 MG tablet, Take 1 tablet (100 mg total) by mouth daily., Disp: 90 tablet, Rfl: 1 .  Multiple Vitamins-Minerals (MULTIVITAMIN WITH MINERALS) tablet, Take  1 tablet by mouth daily., Disp: , Rfl:  .  rosuvastatin (CRESTOR) 10 MG tablet, Take 1 tablet (10 mg total) by mouth at bedtime., Disp: 90 tablet, Rfl: 3 .  sertraline (ZOLOFT) 50 MG tablet, Take 1 tablet (50 mg total) by mouth daily., Disp: 90 tablet, Rfl: 3  Depression screen [facility] 2/9 03/02/2019 09/16/2018 09/02/2018  Decreased Interest 0 0 0  Down, Depressed, Hopeless 0 0 1  PHQ - 2 Score 0 0 1  Altered sleeping 0 - -  Tired, decreased energy 0 - -  Change in appetite 0 - -  Feeling bad or failure about yourself  0 - -  Trouble concentrating 1 - -  Moving slowly or fidgety/restless 3 - -  Suicidal thoughts 0 - -  PHQ-9 Score 4 - -  Difficult doing work/chores Not difficult at all - -    GAD 7 : Generalized Anxiety Score 03/02/2019 09/16/2018 11/21/2017 08/22/2017  Nervous, Anxious, on Edge 3 1 2  0  Control/stop worrying 0 0 2 0  Worry too much - different things 0 0 1 0  Trouble relaxing 3 2 3 1   Restless 1 0 3 0  Easily annoyed or irritable 1 1 2  0  Afraid - awful might happen 1 1 2  0  Total GAD 7 Score 9 5 15 1   Anxiety Difficulty Not difficult at all Not difficult at all Not difficult at all Not difficult at all    -------------------------------------------------------------------------- O: No physical exam performed due to remote telephone encounter.  BP (!) 146/59   Ht 5\' 1"  (1.549 m)   Wt 147 lb (66.7 kg) Comment: pt reported  BMI 27.78 kg/m   Lab results reviewed.  Recent Labs    09/10/18 0956 02/23/19 0837  HGBA1C 5.6 5.8*     Recent Results (from the past 2160 hour(s))  VITAMIN D 25 Hydroxy (Vit-D Deficiency, Fractures)     Status: None   Collection Time: 02/23/19  8:37 AM  Result Value Ref Range   Vit D, 25-Hydroxy 31 30 - 100 ng/mL    Comment: Vitamin D Status         25-OH Vitamin D: . Deficiency:                    <20 ng/mL Insufficiency:             20 - 29 ng/mL Optimal:                 > or = 30 ng/mL . For 25-OH Vitamin D testing on patients  on  D2-supplementation and patients for whom quantitation  of D2 and D3 fractions is required, the QuestAssureD(TM) 25-OH VIT D, (D2,D3), LC/MS/MS is recommended: order  code [phone] (patients >2yrs). See Note 1 . Note 1 . For additional information, please refer to  http://education.Quest[facility].com/faq/FAQ199  (This link is being provided for informational/ educational purposes only.)   Lipid panel     Status: None   Collection Time: 02/23/19  8:37 AM  Result Value Ref Range   Cholesterol 159 <200 mg/dL   HDL 80 > OR = 50 mg/dL   Triglycerides 102 <150 mg/dL   LDL Cholesterol (Calc) 60 mg/dL (calc)    Comment: Reference range: <100 .  Desirable range <100 mg/dL for primary prevention;   <70 mg/dL for patients with CHD or diabetic patients  with > or = 2 CHD risk factors. Marland Kitchen LDL-C is now calculated using the Martin-Hopkins  calculation, which is a validated novel method providing  better accuracy than the Friedewald equation in the  estimation of LDL-C.  Cresenciano Genre et al. Annamaria Helling. [id number]): 2061-2068  (http://education.[facility].com/faq/FAQ164)    Total CHOL/HDL Ratio 2.0 <5.0 (calc)   Non-HDL Cholesterol (Calc) 79 <130 mg/dL (calc)    Comment: For patients with diabetes plus 1 major ASCVD risk  factor, treating to a non-HDL-C goal of <100 mg/dL  (LDL-C of <70 mg/dL) is considered a therapeutic  option.   COMPLETE METABOLIC PANEL WITH GFR     Status: None   Collection Time: 02/23/19  8:37 AM  Result Value Ref Range   Glucose, Bld 89 65 - 99 mg/dL    Comment: .            Fasting reference interval .    BUN 19 7 - 25 mg/dL   Creat 0.64 0.60 - 0.93 mg/dL    Comment: For patients >39 years of age, the reference limit for Creatinine is approximately 13% higher for people identified as African-American. .    GFR, Est Non African American 90 > OR = 60 mL/min/1.110m2   GFR, Est African American 105 > OR = 60 mL/min/1.58m2   BUN/Creatinine Ratio NOT APPLICABLE  6 - 22 (calc)   Sodium 143 135 - 146 mmol/L   Potassium 3.8 3.5 - 5.3 mmol/L   Chloride 103 98 - 110 mmol/L   CO2 28 20 - 32 mmol/L   Calcium 9.9 8.6 - 10.4 mg/dL   Total Protein 7.0 6.1 - 8.1 g/dL   Albumin 4.5 3.6 - 5.1 g/dL   Globulin 2.5 1.9 - 3.7 g/dL (calc)   AG Ratio 1.8 1.0 - 2.5 (calc)   Total Bilirubin 0.8 0.2 - 1.2 mg/dL   Alkaline phosphatase (APISO) 68 37 - 153 U/L   AST 23 10 - 35 U/L   ALT 16 6 - 29 U/L  CBC with Differential/Platelet     Status: None   Collection Time: 02/23/19  8:37 AM  Result Value Ref Range   WBC 7.0 3.8 - 10.8 Thousand/uL   RBC 4.42 3.80 - 5.10 Million/uL   Hemoglobin 13.3 11.7 - 15.5 g/dL   HCT 40.0 35.0 - 45.0 %   MCV 90.5 80.0 - 100.0 fL   MCH 30.1 27.0 - 33.0 pg   MCHC 33.3 32.0 - 36.0 g/dL   RDW 12.3 11.0 - 15.0 %   Platelets 186 140 - 400 Thousand/uL   MPV 12.0 7.5 - 12.5 fL   Neutro Abs 4,459 1,500 - 7,800 cells/uL   Lymphs Abs 1,694 850 - 3,900 cells/uL   Absolute Monocytes 651 200 - 950 cells/uL   Eosinophils Absolute 133 15 - 500 cells/uL   Basophils Absolute 63 0 - 200 cells/uL   Neutrophils Relative % 63.7 %   Total Lymphocyte 24.2 %   Monocytes Relative 9.3 %   Eosinophils Relative 1.9 %   Basophils Relative 0.9 %  Hemoglobin A1c     Status: Abnormal   Collection Time: 02/23/19  8:37 AM  Result Value Ref Range   Hgb A1c MFr Bld 5.8 (H) <5.7 % of total Hgb    Comment: For someone without known diabetes, a hemoglobin  A1c value between 5.7% and 6.4% is consistent with prediabetes and  should be confirmed with a  follow-up test. . For someone with known diabetes, a value <7% indicates that their diabetes is well controlled. A1c targets should be individualized based on duration of diabetes, age, comorbid conditions, and other considerations. . This assay result is consistent with an increased risk of diabetes. . Currently, no consensus exists regarding use of hemoglobin A1c for diagnosis of diabetes for  children. .    Mean Plasma Glucose 120 (calc)   eAG (mmol/L) 6.6 (calc)    -------------------------------------------------------------------------- A&P:  Problem List Items Addressed This Visit    Vitamin D deficiency    Controlled Vit D def on supplement Lab reviewed      Overweight (BMI 25.0-29.9)    Overweight BMI >27 Improving lifestyle and weight Encourage keep improving diet exercise      Hypertension - Primary    Mild elevated BP reading by report, has just taken medications however No known complications  OFF K supplement  Plan:  1. Continue current BP regimen - HCTZ 25mg  daily, Losartan 100mg , Amlodipine 5mg  daily 2. Encourage improved lifestyle - low sodium diet, regular exercise 3. Continue monitor BP outside office, bring readings to next visit, if persistently >140/90 or new symptoms notify office sooner 4. Follow-up 6 months  May consider inc Amlodipine from 5 to 10 in future      Hyperlipidemia    Well controlled lipids now on statin Last lipid panel 02/2019 OFF Red Yeast Rice  Plan: 1. Continue Rosuvastatin 10mg  nightly - Continue Omega 3 1000mg  daily 2. Encourage improved lifestyle - low carb/cholesterol, reduce portion size, continue improving regular exercise      Elevated hemoglobin A1c    Mild increase A1c up to 5.8 now with some poor dietary habits lately by her report Concern with HTN, HLD  Plan:  1. Not on any therapy currently  2. Encourage improved lifestyle - low carb, low sugar diet, reduce portion size, continue improving regular exercise 3. Follow-up 6 months PreDM A1c         No orders of the defined types were placed in this encounter.   Follow-up: - Return in 6 months for PreDM A1c HTN  Return in 1 week for Flu Vaccine.  Patient verbalizes understanding with the above medical recommendations including the limitation of remote medical advice.  Specific follow-up and call-back criteria were given for patient to  follow-up or seek medical care more urgently if needed.   - Time spent in direct consultation with patient on phone: 8 minutes  Heidi Bell, [facility] 03/02/2019, 8:18 AM

## 2019-03-30 ENCOUNTER — Telehealth: Payer: Self-pay | Admitting: Family Medicine

## 2019-03-30 NOTE — Chronic Care Management (AMB) (Signed)
°  Chronic Care Management   Outreach Note  03/30/2019 Name: Heidi Bell MRN: [id number] DOB: 05-24-1950  Referred by: Olin Hauser, DO Reason for referral : Chronic Care Management (Initial CCM outreach was unsuccessful. )   An unsuccessful telephone outreach was attempted today. The patient was referred to the case management team by for assistance with care management and care coordination.   Follow Up Plan: A HIPPA compliant phone message was left for the patient providing contact information and requesting a return call.  The care management team will reach out to the patient again over the next 7 days.  If patient returns call to provider office, please advise to call [place] at [place], [facility]  [place], [place] [address] Direct Dial: [email or web address]  Website: [email or web address]

## 2019-03-31 NOTE — Chronic Care Management (AMB) (Deleted)
  Chronic Care Management   Outreach Note  03/31/2019 Name: Heidi Bell MRN: [id number] DOB: [birth year]  Referred by: Olin Hauser, DO Reason for referral : Chronic Care Management (Initial CCM outreach was unsuccessful. ) and Chronic Care Management (Second CCM outreach was unsuccessful. )   A second unsuccessful telephone outreach was attempted today. The patient was referred to the case management team for assistance with care management and care coordination.   Follow Up Plan: A HIPPA compliant phone message was left for the patient providing contact information and requesting a return call.  The care management team will reach out to the patient again over the next 7 days.  If patient returns call to provider office, please advise to call [place] at [place], [place]  [place], [place] [address] Direct Dial: [email or web address]  Website: [email or web address]

## 2019-03-31 NOTE — Chronic Care Management (AMB) (Signed)
  Chronic Care Management   Note  03/31/2019 Name: Heidi Bell MRN: [id number] DOB: 01-Aug-1950  Heidi Bell is a 68 y.o. year old female who is a primary care patient of Olin Hauser, DO. I reached out to Fredda Hammed by phone today in response to a referral sent by [name]'s health plan.     [name] was given information about Chronic Care Management services today including:  1. CCM service includes personalized support from designated clinical staff supervised by her physician, including individualized plan of care and coordination with other care providers 2. 24/7 contact phone numbers for assistance for urgent and routine care needs. 3. Service will only be billed when office clinical staff spend 20 minutes or more in a month to coordinate care. 4. Only one practitioner may furnish and bill the service in a calendar month. 5. The patient may stop CCM services at any time (effective at the end of the month) by phone call to the office staff. 6. The patient will be responsible for cost sharing (co-pay) of up to 20% of the service fee (after annual deductible is met).  Patients sister [name] agreed to services and verbal consent obtained.   Follow up plan: Telephone appointment with CCM team member scheduled for: 05/07/2019  [place], [facility]  [place], [place] [address] Direct Dial: [email or web address]  Website: [email or web address]

## 2019-05-06 ENCOUNTER — Telehealth: Payer: Self-pay | Admitting: Family Medicine

## 2019-05-06 NOTE — Chronic Care Management (AMB) (Signed)
°  Care Management   Note  05/06/2019 Name: Heidi Bell MRN: [id number] DOB: 1950/04/30  Heidi Bell is a 69 y.o. year old female who is a primary care patient of Olin Hauser, DO and is actively engaged with the care management team. I reached out to Fredda Hammed by phone today to assist with re-scheduling an initial visit with the RN Case Manager  Follow up plan: Telephone appointment with care management team member scheduled for: 06/18/2019  [place], [place] [facility]  [place], [place] [address] Direct Dial: [email or web address]  Website: [email or web address]

## 2019-05-07 ENCOUNTER — Telehealth: Payer: PPO

## 2019-06-05 ENCOUNTER — Other Ambulatory Visit: Payer: Self-pay | Admitting: Family Medicine

## 2019-06-05 DIAGNOSIS — E782 Mixed hyperlipidemia: Secondary | ICD-10-CM

## 2019-06-05 MED ORDER — ROSUVASTATIN CALCIUM 10 MG PO TABS: 10.0000 mg | ORAL_TABLET | Freq: Every day | ORAL | 3 refills | Status: DC

## 2019-06-18 ENCOUNTER — Telehealth: Payer: PPO | Admitting: General Practice

## 2019-06-18 ENCOUNTER — Ambulatory Visit (INDEPENDENT_AMBULATORY_CARE_PROVIDER_SITE_OTHER): Payer: PPO | Admitting: General Practice

## 2019-06-18 DIAGNOSIS — I10 Essential (primary) hypertension: Secondary | ICD-10-CM

## 2019-06-18 DIAGNOSIS — F419 Anxiety disorder, unspecified: Secondary | ICD-10-CM

## 2019-06-18 DIAGNOSIS — E785 Hyperlipidemia, unspecified: Secondary | ICD-10-CM | POA: Diagnosis not present

## 2019-06-18 NOTE — Chronic Care Management (AMB) (Signed)
Chronic Care Management   Initial Visit Note  06/18/2019 Name: Heidi Bell MRN: [id number] DOB: [birth year]  Referred by: Olin Hauser, DO Reason for referral : Chronic Care Management (Initial: Chronic Disease Management and care coordination needs)   Heidi Bell is a 69 y.o. year old female who is a primary care patient of Olin Hauser, DO. The CCM team was consulted for assistance with chronic disease management and care coordination needs related to HTN, HLD and Anxiety  Review of patient status, including review of consultants reports, relevant laboratory and other test results, and collaboration with appropriate care team members and the patient's provider was performed as part of comprehensive patient evaluation and provision of chronic care management services.    SDOH (Social Determinants of Health) assessments performed: Yes See Care Plan activities for detailed interventions related to SDOH  SDOH Interventions     Most Recent Value  SDOH Interventions  SDOH Interventions for the Following Domains  Physical Activity, Social Connections  Physical Activity Interventions  Other (Comments) [does not do a lot of physical activity.]  Social Connections Interventions  Other (Comment) [good support system- sisters help a lot]       Medications: Outpatient Encounter Medications as of 06/18/2019  Medication Sig   amLODipine (NORVASC) 5 MG tablet Take 1 tablet (5 mg total) by mouth daily.   Ascorbic Acid (VITAMIN C) 500 MG CAPS Take 500 mg by mouth daily. (Patient taking differently: Take 1,000 mg by mouth daily. )   Calcium Carb-Cholecalciferol (CALCIUM PLUS VITAMIN D3) 600-800 MG-UNIT TABS Take by mouth.   Garlic 8676 MG CAPS Take by mouth 2 (two) times daily.    glucosamine-chondroitin 500-400 MG tablet Take 1 tablet by mouth 2 (two) times daily. (Patient taking differently: Take 1 tablet by mouth 2 (two) times daily. )   hydrochlorothiazide  (HYDRODIURIL) 25 MG tablet Take 1 tablet (25 mg total) by mouth daily.   losartan (COZAAR) 100 MG tablet Take 1 tablet (100 mg total) by mouth daily.   Multiple Vitamins-Minerals (MULTIVITAMIN WITH MINERALS) tablet Take 1 tablet by mouth daily.   rosuvastatin (CRESTOR) 10 MG tablet Take 1 tablet (10 mg total) by mouth at bedtime.   sertraline (ZOLOFT) 50 MG tablet Take 1 tablet (50 mg total) by mouth daily.   No facility-administered encounter medications on file as of 06/18/2019.     Objective:  BP Readings from Last 3 Encounters:  03/02/19 (!) 146/59  09/16/18 (!) 136/54  09/02/18 (!) 133/56    Goals Addressed            This Visit's Progress    RNCM: [name]: "[name]" (pt-stated)       CARE PLAN ENTRY (see longtitudinal plan of care for additional care plan information)  Current Barriers:   Chronic Disease Management support, education, and care coordination needs related to HTN, HLD, and Anxiety  Clinical Goal(s) related to HTN, HLD, and Anxiety:  Over the next 120 days, patient will:   Work with the care management team to address educational, disease management, and care coordination needs   Begin or continue self health monitoring activities as directed today Measure and record blood pressure 4 times per week and adhere to a heart healthy diet, increase activity as tolerated  Call provider office for new or worsened signs and symptoms Blood pressure findings outside established parameters and New or worsened symptom related to HLD and anxiety  Call  care management team with questions or concerns  Verbalize basic understanding of patient centered plan of care established today  Interventions related to HTN, HLD, and Anxiety:   Evaluation of current treatment plans and patient's adherence to plan as established by provider  Assessed patient understanding of disease states: Sisters are knowledgeable about their  sisters condition. RNCM will reach out to the patient on 3/29 to evaluate patient   Assessed patient's education and care coordination needs: No current needs noted at this time. The patients sister [name] aware of resources to help the patient met her health and wellness needs   Provided disease specific education to patient: Education on taking blood pressure on a consistent basis and recording readings. Will discuss this with the patient on outreach.   Evaluation on medication compliance. The sisters are unsure if the patient is taking medications as directed. The patient does have a pill container but some things she has mentioned have lead them to question the validity of her self care habits such as medication administration and checking blood pressures.  Collaborated with appropriate clinical care team members regarding patient needs.  The patients sisters know about CCM team support of pharmacist and LCSW.  No needs identified at this time.   Patient Self Care Activities related to HTN, HLD, and Anxiety:   Patient is unable to independently self-manage chronic health conditions  Initial goal documentation         [name] was given information about Chronic Care Management services today including:  1. CCM service includes personalized support from designated clinical staff supervised by her physician, including individualized plan of care and coordination with other care providers 2. 24/7 contact phone numbers for assistance for urgent and routine care needs. 3. Service will only be billed when office clinical staff spend 20 minutes or more in a month to coordinate care. 4. Only one practitioner may furnish and bill the service in a calendar month. 5. The patient may stop CCM services at any time (effective at the end of the month) by phone call to the office staff. 6. The patient will be responsible for cost sharing (co-pay) of up to 20% of the service fee (after annual  deductible is met).  Patient agreed to services and verbal consent obtained.   Plan:   Telephone follow up appointment with care management team member scheduled for: 06/29/2019 at 1:30pm with the patient  Noreene Larsson RN, MSN, [place] Mobile: [phone]

## 2019-06-18 NOTE — Patient Instructions (Signed)
Visit Information  Goals Addressed            This Visit's Progress   . RNCM: [name]: "[name]" (pt-stated)       [place] (see longtitudinal plan of care for additional care plan information)  Current Barriers:  . Chronic Disease Management support, education, and care coordination needs related to HTN, HLD, and Anxiety  Clinical Goal(s) related to HTN, HLD, and Anxiety:  Over the next 120 days, patient will:  . Work with the care management team to address educational, disease management, and care coordination needs  . Begin or continue self health monitoring activities as directed today Measure and record blood pressure 4 times per week and adhere to a heart healthy diet, increase activity as tolerated . Call provider office for new or worsened signs and symptoms Blood pressure findings outside established parameters and New or worsened symptom related to HLD and anxiety . Call care management team with questions or concerns . Verbalize basic understanding of patient centered plan of care established today  Interventions related to HTN, HLD, and Anxiety:  . Evaluation of current treatment plans and patient's adherence to plan as established by provider . Assessed patient understanding of disease states: Sisters are knowledgeable about their sisters condition. RNCM will reach out to the patient on 3/29 to evaluate patient  . Assessed patient's education and care coordination needs: No current needs noted at this time. The patients sister Heidi Bell aware of resources to help the patient met her health and wellness needs  . Provided disease specific education to patient: Education on taking blood pressure on a consistent basis and recording readings. Will discuss this with the patient on outreach.  . Evaluation on medication compliance. The sisters are unsure if the patient is taking medications as directed. The patient does have a pill  container but some things she has mentioned have lead them to question the validity of her self care habits such as medication administration and checking blood pressures. Heidi Bell with appropriate clinical care team members regarding patient needs.  The patients sisters know about CCM team support of pharmacist and LCSW.  No needs identified at this time.   Patient Self Care Activities related to HTN, HLD, and Anxiety:  . Patient is unable to independently self-manage chronic health conditions  Initial goal documentation        Heidi Bell was given information about Chronic Care Management services today including:  1. CCM service includes personalized support from designated clinical staff supervised by her physician, including individualized plan of care and coordination with other care providers 2. 24/7 contact phone numbers for assistance for urgent and routine care needs. 3. Service will only be billed when office clinical staff spend 20 minutes or more in a month to coordinate care. 4. Only one practitioner may furnish and bill the service in a calendar month. 5. The patient may stop CCM services at any time (effective at the end of the month) by phone call to the office staff. 6. The patient will be responsible for cost sharing (co-pay) of up to 20% of the service fee (after annual deductible is met).  Patient agreed to services and verbal consent obtained.   Patient verbalizes understanding of instructions provided today.   Telephone follow up appointment with care management team member scheduled for: 06/29/2019 at 1:30pm with the patient   Heidi Larsson RN, Heidi Bell, Heidi Bell  Heidi Bell: Heidi Bell   DASH Eating Plan DASH stands for "Dietary Approaches to Stop Hypertension." The DASH eating plan is a healthy eating plan that has been shown to reduce high blood pressure (hypertension). It may  also reduce your risk for type 2 diabetes, heart disease, and stroke. The DASH eating plan may also help with weight loss. What are tips for following this plan?  General guidelines  Avoid eating more than 2,300 mg (milligrams) of salt (sodium) a day. If you have hypertension, you may need to reduce your sodium intake to 1,500 mg a day.  Limit alcohol intake to no more than 1 drink a day for nonpregnant women and 2 drinks a day for men. One drink equals 12 oz of beer, 5 oz of wine, or 1 oz of hard liquor.  Work with your health care provider to maintain a healthy body weight or to lose weight. Ask what an ideal weight is for you.  Get at least 30 minutes of exercise that causes your heart to beat faster (aerobic exercise) most days of the week. Activities may include walking, swimming, or biking.  Work with your health care provider or diet and nutrition specialist (dietitian) to adjust your eating plan to your individual calorie needs. Reading food labels   Check food labels for the amount of sodium per serving. Choose foods with less than 5 percent of the Daily Value of sodium. Generally, foods with less than 300 mg of sodium per serving fit into this eating plan.  To find whole grains, look for the word "whole" as the first word in the ingredient list. Shopping  Buy products labeled as "low-sodium" or "no salt added."  Buy fresh foods. Avoid canned foods and premade or frozen meals. Cooking  Avoid adding salt when cooking. Use salt-free seasonings or herbs instead of table salt or sea salt. Check with your health care provider or pharmacist before using salt substitutes.  Do not fry foods. Cook foods using healthy methods such as baking, boiling, grilling, and broiling instead.  Cook with heart-healthy oils, such as olive, canola, soybean, or sunflower oil. Meal planning  Eat a balanced diet that includes: ? 5 or more servings of fruits and vegetables each day. At each meal,  try to fill half of your plate with fruits and vegetables. ? Up to 6-8 servings of whole grains each day. ? Less than 6 oz of lean meat, poultry, or fish each day. A 3-oz serving of meat is about the same size as a deck of cards. One egg equals 1 oz. ? 2 servings of low-fat dairy each day. ? A serving of nuts, seeds, or beans 5 times each week. ? Heart-healthy fats. Healthy fats called Omega-3 fatty acids are found in foods such as flaxseeds and coldwater fish, like sardines, salmon, and mackerel.  Limit how much you eat of the following: ? Canned or prepackaged foods. ? Food that is high in trans fat, such as fried foods. ? Food that is high in saturated fat, such as fatty meat. ? Sweets, desserts, sugary drinks, and other foods with added sugar. ? Full-fat dairy products.  Do not salt foods before eating.  Try to eat at least 2 vegetarian meals each week.  Eat more home-cooked food and less restaurant, buffet, and fast food.  When eating at a restaurant, ask that your food be prepared with less salt or no salt, if possible. What foods are recommended? The items  listed may not be a complete list. Talk with your dietitian about what dietary choices are best for you. Grains Whole-grain or whole-wheat bread. Whole-grain or whole-wheat pasta. Brown rice. [place]. Bulgur. Whole-grain and low-sodium cereals. Pita bread. Low-fat, low-sodium crackers. Whole-wheat flour tortillas. Vegetables Fresh or frozen vegetables (raw, steamed, roasted, or grilled). Low-sodium or reduced-sodium tomato and vegetable juice. Low-sodium or reduced-sodium tomato sauce and tomato paste. Low-sodium or reduced-sodium canned vegetables. Fruits All fresh, dried, or frozen fruit. Canned fruit in natural juice (without added sugar). Meat and other protein foods Skinless chicken or [place]. Ground chicken or [place]. Pork with fat trimmed off. Fish and seafood. Egg whites. Dried beans, peas, or lentils. Unsalted  nuts, nut butters, and seeds. Unsalted canned beans. Lean cuts of beef with fat trimmed off. Low-sodium, lean deli meat. Dairy Low-fat (1%) or fat-free (skim) milk. Fat-free, low-fat, or reduced-fat cheeses. Nonfat, low-sodium ricotta or cottage cheese. Low-fat or nonfat yogurt. Low-fat, low-sodium cheese. Fats and oils Soft margarine without trans fats. Vegetable oil. Low-fat, reduced-fat, or light mayonnaise and salad dressings (reduced-sodium). Canola, safflower, olive, soybean, and sunflower oils. Avocado. Seasoning and other foods Herbs. Spices. Seasoning mixes without salt. Unsalted popcorn and pretzels. Fat-free sweets. What foods are not recommended? The items listed may not be a complete list. Talk with your dietitian about what dietary choices are best for you. Grains Baked goods made with fat, such as croissants, muffins, or some breads. Dry pasta or rice meal packs. Vegetables Creamed or fried vegetables. Vegetables in a cheese sauce. Regular canned vegetables (not low-sodium or reduced-sodium). Regular canned tomato sauce and paste (not low-sodium or reduced-sodium). Regular tomato and vegetable juice (not low-sodium or reduced-sodium). Angie Fava. Olives. Fruits Canned fruit in a light or heavy syrup. Fried fruit. Fruit in cream or butter sauce. Meat and other protein foods Fatty cuts of meat. Ribs. Fried meat. Berniece Salines. Sausage. Bologna and other processed lunch meats. Salami. Fatback. Hotdogs. Bratwurst. Salted nuts and seeds. Canned beans with added salt. Canned or smoked fish. Whole eggs or egg yolks. Chicken or [place] with skin. Dairy Whole or 2% milk, cream, and half-and-half. Whole or full-fat cream cheese. Whole-fat or sweetened yogurt. Full-fat cheese. Nondairy creamers. Whipped toppings. Processed cheese and cheese spreads. Fats and oils Butter. Stick margarine. Lard. Shortening. Ghee. Bacon fat. Tropical oils, such as coconut, palm kernel, or palm oil. Seasoning and other  foods Salted popcorn and pretzels. Onion salt, garlic salt, seasoned salt, table salt, and sea salt. Worcestershire sauce. Tartar sauce. Barbecue sauce. Teriyaki sauce. Soy sauce, including reduced-sodium. Steak sauce. Canned and packaged gravies. Fish sauce. Oyster sauce. Cocktail sauce. Horseradish that you find on the shelf. Ketchup. Mustard. Meat flavorings and tenderizers. Bouillon cubes. Hot sauce and Tabasco sauce. Premade or packaged marinades. Premade or packaged taco seasonings. Relishes. Regular salad dressings. Where to find more information:  National Heart, Lung, and [place]: https://wilson-eaton.com/  American Heart Association: www.heart.org Summary  The DASH eating plan is a healthy eating plan that has been shown to reduce high blood pressure (hypertension). It may also reduce your risk for type 2 diabetes, heart disease, and stroke.  With the DASH eating plan, you should limit salt (sodium) intake to 2,300 mg a day. If you have hypertension, you may need to reduce your sodium intake to 1,500 mg a day.  When on the DASH eating plan, aim to eat more fresh fruits and vegetables, whole grains, lean proteins, low-fat dairy, and heart-healthy fats.  Work with your health care provider or  diet and nutrition specialist (dietitian) to adjust your eating plan to your individual calorie needs. This information is not intended to replace advice given to you by your health care provider. Make sure you discuss any questions you have with your health care provider. Document Revised: 03/01/2017 Document Reviewed: 03/12/2016 Elsevier Patient Education  2020 [employer].

## 2019-06-29 ENCOUNTER — Ambulatory Visit: Payer: Self-pay | Admitting: General Practice

## 2019-06-29 ENCOUNTER — Telehealth: Payer: Self-pay | Admitting: General Practice

## 2019-06-29 DIAGNOSIS — I10 Essential (primary) hypertension: Secondary | ICD-10-CM | POA: Diagnosis not present

## 2019-06-29 DIAGNOSIS — E785 Hyperlipidemia, unspecified: Secondary | ICD-10-CM

## 2019-06-29 DIAGNOSIS — F419 Anxiety disorder, unspecified: Secondary | ICD-10-CM

## 2019-06-29 NOTE — Chronic Care Management (AMB) (Signed)
Chronic Care Management   Follow Up Note   06/29/2019 Name: Heidi Bell MRN: [id number] DOB: November 19, 1950  Referred by: Heidi Hauser, DO Reason for referral : Chronic Care Management (RNCM Chronic Disease Management and Care Coordination Needs )   Heidi Bell is a 69 y.o. year old female who is a primary care patient of Heidi Hauser, DO. The CCM team was consulted for assistance with chronic disease management and care coordination needs.    Review of patient status, including review of consultants reports, relevant laboratory and other test results, and collaboration with appropriate care team members and the patient's provider was performed as part of comprehensive patient evaluation and provision of chronic care management services.    SDOH (Social Determinants of Health) assessments performed: No See Care Plan activities for detailed interventions related to [facility])     Outpatient Encounter Medications as of 06/29/2019  Medication Sig  . amLODipine (NORVASC) 5 MG tablet Take 1 tablet (5 mg total) by mouth daily.  . Ascorbic Acid (VITAMIN C) 500 MG CAPS Take 500 mg by mouth daily. (Patient taking differently: Take 1,000 mg by mouth daily. )  . Garlic 1791 MG CAPS Take by mouth 2 (two) times daily.   Marland Kitchen glucosamine-chondroitin 500-400 MG tablet Take 1 tablet by mouth 2 (two) times daily. (Patient taking differently: Take 1 tablet by mouth 2 (two) times daily. )  . hydrochlorothiazide (HYDRODIURIL) 25 MG tablet Take 1 tablet (25 mg total) by mouth daily.  Marland Kitchen losartan (COZAAR) 100 MG tablet Take 1 tablet (100 mg total) by mouth daily.  . Multiple Vitamins-Minerals (MULTIVITAMIN WITH MINERALS) tablet Take 1 tablet by mouth daily.  Marland Kitchen pyridOXINE (VITAMIN B-6) 100 MG tablet Take 100 mg by mouth daily.  . rosuvastatin (CRESTOR) 10 MG tablet Take 1 tablet (10 mg total) by mouth at bedtime.  . sertraline (ZOLOFT) 50 MG tablet Take 1 tablet (50 mg total) by mouth  daily.  . Calcium Carb-Cholecalciferol (CALCIUM PLUS VITAMIN D3) 600-800 MG-UNIT TABS Take by mouth.   No facility-administered encounter medications on file as of 06/29/2019.     Objective:   Goals Addressed            This Visit's Progress   . RNCM: [name]: "[name]" (pt-stated)       [place] (see longtitudinal plan of care for additional care plan information)  Current Barriers:  . Chronic Disease Management support, education, and care coordination needs related to HTN, HLD, and Anxiety  Clinical Goal(s) related to HTN, HLD, and Anxiety:  Over the next 120 days, patient will:  . Work with the care management team to address educational, disease management, and care coordination needs  . Begin or continue self health monitoring activities as directed today Measure and record blood pressure 4 times per week and adhere to a heart healthy diet, increase activity as tolerated . Call provider office for new or worsened signs and symptoms Blood pressure findings outside established parameters and New or worsened symptom related to HLD and anxiety . Call care management team with questions or concerns . Verbalize basic understanding of patient centered plan of care established today  Interventions related to HTN, HLD, and Anxiety:  . Evaluation of current treatment plans and patient's adherence to plan as established by provider. The patient called today and she was able to review all her meds with the [facility] and tell the RNCM what they  were for . Assessed patient understanding of disease states: Sisters are knowledgeable about their sisters condition. RNCM will reach out to the patient on 3/29 to evaluate patient. The patient answered questions appropriately. The patient feels she does a good job at taking care of herself. Able to tell RNCM her routine and upcoming appointments.  . Assessed patient's education and care coordination  needs: No current needs noted at this time. The patients sister Heidi Bell aware of resources to help the patient met her health and wellness needs  . Provided disease specific education to patient: Education on taking blood pressure on a consistent basis and recording readings. Will discuss this with the patient on outreach. The patient is taking consistently. Last reading was 10/78 on Saturday 3/27. . Evaluation on medication compliance. The sisters are unsure if the patient is taking medications as directed. The patient does have a pill container but some things she has mentioned have lead them to question the validity of her self care habits such as medication administration and checking blood pressures. The patient reviewed all her medications with the [facility] today. The patient states she puts her medications in a container each day for morning and night time. Able to consistently answer questions appropriately concerning medications and how she takes.  Heidi Bell with appropriate clinical care team members regarding patient needs.  The patients sisters know about CCM team support of pharmacist and LCSW.  No needs identified at this time.   Patient Self Care Activities related to HTN, HLD, and Anxiety:  . Patient is unable to independently self-manage chronic health conditions  Please see past updates related to this goal by clicking on the "Past Updates" button in the selected goal          Plan:   The care management team will reach out to the patient again over the next 30 to 60 days.    Heidi Larsson RN, MSN, [place] Mobile: [phone]

## 2019-06-29 NOTE — Patient Instructions (Signed)
Visit Information  Goals Addressed            This Visit's Progress   . RNCM: [name]: "[name]" (pt-stated)       [place] (see longtitudinal plan of care for additional care plan information)  Current Barriers:  . Chronic Disease Management support, education, and care coordination needs related to HTN, HLD, and Anxiety  Clinical Goal(s) related to HTN, HLD, and Anxiety:  Over the next 120 days, patient will:  . Work with the care management team to address educational, disease management, and care coordination needs  . Begin or continue self health monitoring activities as directed today Measure and record blood pressure 4 times per week and adhere to a heart healthy diet, increase activity as tolerated . Call provider office for new or worsened signs and symptoms Blood pressure findings outside established parameters and New or worsened symptom related to HLD and anxiety . Call care management team with questions or concerns . Verbalize basic understanding of patient centered plan of care established today  Interventions related to HTN, HLD, and Anxiety:  . Evaluation of current treatment plans and patient's adherence to plan as established by provider. The patient called today and she was able to review all her meds with the [facility] and tell the [facility] what they were for . Assessed patient understanding of disease states: Sisters are knowledgeable about their sisters condition. RNCM will reach out to the patient on 3/29 to evaluate patient. The patient answered questions appropriately. The patient feels she does a good job at taking care of herself. Able to tell RNCM her routine and upcoming appointments.  . Assessed patient's education and care coordination needs: No current needs noted at this time. The patients sister [name] aware of resources to help the patient met her health and wellness needs  . Provided disease specific  education to patient: Education on taking blood pressure on a consistent basis and recording readings. Will discuss this with the patient on outreach. The patient is taking consistently. Last reading was 10/78 on Saturday 3/27. . Evaluation on medication compliance. The sisters are unsure if the patient is taking medications as directed. The patient does have a pill container but some things she has mentioned have lead them to question the validity of her self care habits such as medication administration and checking blood pressures. The patient reviewed all her medications with the [facility] today. The patient states she puts her medications in a container each day for morning and night time. Able to consistently answer questions appropriately concerning medications and how she takes.  [name] with appropriate clinical care team members regarding patient needs.  The patients sisters know about CCM team support of pharmacist and LCSW.  No needs identified at this time.   Patient Self Care Activities related to HTN, HLD, and Anxiety:  . Patient is unable to independently self-manage chronic health conditions  Please see past updates related to this goal by clicking on the "Past Updates" button in the selected goal         Patient verbalizes understanding of instructions provided today.   The care management team will reach out to the patient again over the next 30 to 60 days.   Noreene Larsson RN, MSN, [place] Mobile: [phone]

## 2019-08-17 ENCOUNTER — Telehealth: Payer: Self-pay

## 2019-08-17 ENCOUNTER — Ambulatory Visit: Payer: Self-pay | Admitting: General Practice

## 2019-08-17 NOTE — Chronic Care Management (AMB) (Signed)
°  Chronic Care Management   Outreach Note  08/17/2019 Name: Heidi Bell MRN: [id number] DOB: [birth year]  Referred by: Olin Hauser, DO Reason for referral : Chronic Care Management (Follow up call: RNCM and Care Coordiantion Needs )   An unsuccessful telephone outreach was attempted today. The patient was referred to the case management team for assistance with care management and care coordination. Spoke to the patient sister [name]. She was not interested in the program anymore if the patient was going to be billed for services.  Education provided and will look into the billing for this patient but the patients sister did not wish to continue the call.   Follow Up Plan: The care management team is available to follow up with the patient after provider conversation with the patient regarding recommendation for care management engagement and subsequent re-referral to the care management team.   Noreene Larsson RN, MSN, [facility] Mobile: [phone]

## 2019-08-24 ENCOUNTER — Other Ambulatory Visit: Payer: Self-pay | Admitting: Family Medicine

## 2019-08-24 DIAGNOSIS — I10 Essential (primary) hypertension: Secondary | ICD-10-CM

## 2019-08-24 NOTE — Telephone Encounter (Signed)
Requested Prescriptions  Pending Prescriptions Disp Refills  . amLODipine (NORVASC) 5 MG tablet [Pharmacy Med Name: amLODIPine BESYLATE 5 MG TAB] 90 tablet 0    Sig: Take 1 tablet by mouth daily     Cardiovascular:  Calcium Channel Blockers Failed - 08/24/2019  4:52 PM      Failed - Valid encounter within last 6 months    Recent Outpatient Visits          5 months ago Essential hypertension   [place], DO   11 months ago Elevated hemoglobin A1c   [place], DO   1 year ago Bilateral impacted cerumen   [place], DO   1 year ago Annual physical exam   [facility] Olin Hauser, DO   1 year ago Essential hypertension   [place], DO      Future Appointments            In 4 days [place], DO [facility], [place] BP in normal range    BP Readings from Last 1 Encounters:  06/27/19 110/78         . hydrochlorothiazide (HYDRODIURIL) 25 MG tablet [Pharmacy Med Name: HYDROCHLOROTHIAZIDE 25 MG TAB] 90 tablet 0    Sig: Take 1 tablet by mouth daily     Cardiovascular: Diuretics - Thiazide Failed - 08/24/2019  4:52 PM      Failed - Valid encounter within last 6 months    Recent Outpatient Visits          5 months ago Essential hypertension   [place], DO   11 months ago Elevated hemoglobin A1c   [place], DO   1 year ago Bilateral impacted cerumen   [facility] Olin Hauser, DO   1 year ago Annual physical exam   [facility] Olin Hauser, DO   1 year ago Essential hypertension   [place], DO      Future Appointments            In 4 days  [place], Devonne Doughty, [facility], [place] in normal range and within 360 days    Calcium  Date Value Ref Range Status  02/23/2019 9.9 8.6 - 10.4 mg/dL Final         Passed - Cr in normal range and within 360 days    Creat  Date Value Ref Range Status  02/23/2019 0.64 0.60 - 0.93 mg/dL Final    Comment:    For patients >72 years of age, the reference limit for Creatinine is approximately 13% higher for people identified as African-American. .          Passed - K in normal range and within 360 days    Potassium  Date Value Ref Range Status  02/23/2019 3.8 3.5 - 5.3 mmol/L Final         Passed - Na in normal range and within 360 days    Sodium  Date Value Ref Range Status  02/23/2019 143 135 - 146 mmol/L Final         Passed -  Last BP in normal range    BP Readings from Last 1 Encounters:  06/27/19 110/78         . losartan (COZAAR) 100 MG tablet [Pharmacy Med Name: LOSARTAN POTASSIUM 100 MG TAB] 90 tablet 0    Sig: Take 1 tablet by mouth once daily     Cardiovascular:  Angiotensin Receptor Blockers Failed - 08/24/2019  4:52 PM      Failed - Cr in normal range and within 180 days    Creat  Date Value Ref Range Status  02/23/2019 0.64 0.60 - 0.93 mg/dL Final    Comment:    For patients >32 years of age, the reference limit for Creatinine is approximately 13% higher for people identified as African-American. .          Failed - K in normal range and within 180 days    Potassium  Date Value Ref Range Status  02/23/2019 3.8 3.5 - 5.3 mmol/L Final         Failed - Valid encounter within last 6 months    Recent Outpatient Visits          5 months ago Essential hypertension   [place], DO   11 months ago Elevated hemoglobin A1c   [place], DO   1 year ago Bilateral impacted cerumen   [facility] Olin Hauser, DO   1 year ago Annual physical exam   [facility] Olin Hauser, DO   1 year ago Essential hypertension   [place], DO      Future Appointments            In 4 days [place], Devonne Doughty, [facility], [place] - Patient is not pregnant      Passed - Last BP in normal range    BP Readings from Last 1 Encounters:  06/27/19 110/78

## 2019-08-28 ENCOUNTER — Ambulatory Visit (INDEPENDENT_AMBULATORY_CARE_PROVIDER_SITE_OTHER): Payer: PPO | Admitting: Family Medicine

## 2019-08-28 ENCOUNTER — Other Ambulatory Visit: Payer: Self-pay

## 2019-08-28 ENCOUNTER — Encounter: Payer: Self-pay | Admitting: Family Medicine

## 2019-08-28 ENCOUNTER — Other Ambulatory Visit: Payer: Self-pay | Admitting: Family Medicine

## 2019-08-28 VITALS — BP 143/44 | HR 65 | Temp 98.2°F | Resp 16 | Ht 61.0 in | Wt 140.6 lb

## 2019-08-28 DIAGNOSIS — E559 Vitamin D deficiency, unspecified: Secondary | ICD-10-CM

## 2019-08-28 DIAGNOSIS — E663 Overweight: Secondary | ICD-10-CM | POA: Diagnosis not present

## 2019-08-28 DIAGNOSIS — F419 Anxiety disorder, unspecified: Secondary | ICD-10-CM | POA: Diagnosis not present

## 2019-08-28 DIAGNOSIS — Z0000 Encounter for general adult medical examination without abnormal findings: Secondary | ICD-10-CM

## 2019-08-28 DIAGNOSIS — I10 Essential (primary) hypertension: Secondary | ICD-10-CM | POA: Diagnosis not present

## 2019-08-28 DIAGNOSIS — E782 Mixed hyperlipidemia: Secondary | ICD-10-CM

## 2019-08-28 DIAGNOSIS — R7309 Other abnormal glucose: Secondary | ICD-10-CM

## 2019-08-28 LAB — POCT GLYCOSYLATED HEMOGLOBIN (HGB A1C): Hemoglobin A1C: 5.8 % — AB (ref 4.0–5.6)

## 2019-08-28 NOTE — Patient Instructions (Addendum)
Thank you for coming to the office today.  Leg cramps - Try spoonful of yellow mustard to relieve leg cramps or try daily to prevent the problem  - OTC natural option is Hyland's Leg Cramps (Dissolving tablet) take as needed for muscle cramps   Try to change your drinking habits with your water and other drinks If it involves solid foods then let me know If you can resolve it then we are good. I am not worried. We can refer you for swallowing test if need  May try indigestion medicine in future - Prilosec or Pepcid  You most likely have Carpal Tunnel Syndrome of both hand/wrist based on your symptoms. This a problem of compression on the nerve entering the hand at the wrist. Often it is caused by long history of overuse or repetitive activities that put strain on the nerve within wrist. Occasionally this can be caused by swelling or weight gain and pressure on this nerve as well.  Recommend trial of Anti-inflammatory with Aleve 220 or 250mg  tabs - take one to two with food and plenty of water TWICE daily every day (breakfast and dinner), for 1 - 2, then you may take only as needed - DO NOT TAKE any ibuprofen, motrin while you are taking this medicine - It is safe to take Tylenol Ext Str 500mg  tabs - take 1 to 2 (max dose 1000mg ) every 6 hours as needed for breakthrough pain, max 24 hour daily dose is 6 to 8 tablets or 4000mg   "Carpal Tunnel Wrist Brace or Splint" - [employer], [employer], [facility], - leaves thumb open and free to move - (NOT the thumb spica splint)  Recommend to start taking Tylenol Extra Strength 500mg  tabs - take 1 to 2 tabs per dose (max 1000mg ) every 6-8 hours for pain (take regularly, don't skip a dose for next 7 days), max 24 hour daily dose is 6 tablets or 3000mg . In the future you can repeat the same everyday Tylenol course for 1-2 weeks at a time.    DUE for FASTING BLOOD WORK (no food or drink after midnight before the lab appointment, only water or coffee without  cream/sugar on the morning of)  SCHEDULE "Lab Only" visit in the morning at the clinic for lab draw in 6 MONTHS   - Make sure Lab Only appointment is at about 1 week before your next appointment, so that results will be available  For Lab Results, once available within 2-3 days of blood draw, you can can log in to MyChart online to view your results and a brief explanation. Also, we can discuss results at next follow-up visit.    Please schedule a Follow-up Appointment to: Return in about 6 months (around 02/28/2020) for Annual Physical.  If you have any other questions or concerns, please feel free to call the office or send a message through [facility]. You may also schedule an earlier appointment if necessary.  Additionally, you may be receiving a survey about your experience at our office within a few days to 1 week by e-mail or mail. We value your feedback.  Nobie Putnam, DO [place]

## 2019-08-28 NOTE — Progress Notes (Signed)
Subjective:    Patient ID: Heidi Bell, female    DOB: Jul 21, 1950, 69 y.o.   MRN: [id number]  Heidi Bell is a 69 y.o. female presenting on 08/28/2019 for Hypertension   HPI   Additional complaints  History of Carpal Tunnel, bilateral Both hands with fingertip numbness and some cramping. She does repetitive activity with hands at times and reports worse in evening and when wake up  GERD Has history of GERD symptoms with gas production belch or bubble in throat at times, worse after meal and evening. Admits choking on water frequently if drink too fast, not on food or solids.   Elevated A1c Last A1c up to 5.8, attributed to eating more sweets. Today due for A1c She attributes improvement to lifestyle changes. No history of DM Not checking Sugar Never on medication  HYPERLIPIDEMIA:/ Overweight BMI >26 Last visit 02/2019 she had elevated in past. Now controlled on rosuvastatin. Last lipid 02/2019, controlled LDL - Currently takingRosuvastatin 10mg  nightly , tolerating well without side effect or myalgia - On Fish Oil omega 3 1,000 mg daily Lifestyle Exercise: Unable to go to gym for regular exercise. Limited  Weight relatively stable - Diet:balanced dietreduced carb and sugar intake  CHRONIC HTN: Last home BP reading 130-140s, before medicine. Usually checking with normal readings by report Current Meds -HCTZ 25mg  daily, Losartan 100mg , Amlodipine 5mg  daily - OFF Potassium supplement Reports good compliance, took meds today. Tolerating well, w/o complaints. Denies CP, dyspnea, HA, edema, dizziness / lightheadedness  Vitamin D Deficiency Last lab was normal 31. Taking OTC Vitamin D  Chronic Anxiety Stable on Sertraline  HYPERLIPIDEMIA: - Reports no concerns. Last lipid panel 02/2019, controlled on statin - Currently taking Rosuvastatin 10mg  nightly, tolerating well without side effects or myalgias   Depression screen [facility] 2/9 08/28/2019 06/18/2019  03/02/2019  Decreased Interest 0 0 0  Down, Depressed, Hopeless 0 0 0  PHQ - 2 Score 0 0 0  Altered sleeping - - 0  Tired, decreased energy - - 0  Change in appetite - - 0  Feeling bad or failure about yourself  - - 0  Trouble concentrating - - 1  Moving slowly or fidgety/restless - - 3  Suicidal thoughts - - 0  PHQ-9 Score - - 4  Difficult doing work/chores - - Not difficult at all    Social History   Tobacco Use  . Smoking status: Never Smoker  . Smokeless tobacco: Never Used  Substance Use Topics  . Alcohol use: No    Alcohol/week: 0.0 standard drinks  . Drug use: No    Review of Systems Per HPI unless specifically indicated above     Objective:    BP (!) 143/44   Pulse 65   Temp 98.2 F (36.8 C) (Temporal)   Resp 16   Ht 5\' 1"  (1.549 m)   Wt 140 lb 9.6 oz (63.8 kg)   BMI 26.57 kg/m   Wt Readings from Last 3 Encounters:  08/28/19 140 lb 9.6 oz (63.8 kg)  03/02/19 147 lb (66.7 kg)  09/16/18 143 lb 9.6 oz (65.1 kg)    Physical Exam Vitals and nursing note reviewed.  Constitutional:      General: She is not in acute distress.    Appearance: She is well-developed. She is not diaphoretic.     Comments: Well-appearing, comfortable, cooperative  HENT:     Head: Normocephalic and atraumatic.  Eyes:     General:  Right eye: No discharge.        Left eye: No discharge.     Conjunctiva/sclera: Conjunctivae normal.  Neck:     Thyroid: No thyromegaly.  Cardiovascular:     Rate and Rhythm: Normal rate and regular rhythm.     Heart sounds: Normal heart sounds. No murmur.  Pulmonary:     Effort: Pulmonary effort is normal. No respiratory distress.     Breath sounds: Normal breath sounds. No wheezing or rales.  Musculoskeletal:        General: Normal range of motion.     Cervical back: Normal range of motion and neck supple.  Lymphadenopathy:     Cervical: No cervical adenopathy.  Skin:    General: Skin is warm and dry.     Findings: No erythema or  rash.  Neurological:     Mental Status: She is alert and oriented to person, place, and time.  Psychiatric:        Behavior: Behavior normal.     Comments: Well groomed, good eye contact, normal speech and thoughts Anxious at baseline      Recent Labs    09/10/18 0956 02/23/19 0837 08/28/19 0851  HGBA1C 5.6 5.8* 5.8*     Results for orders placed or performed in visit on 08/28/19  POCT HgB A1C  Result Value Ref Range   Hemoglobin A1C 5.8 (A) 4.0 - 5.6 %      Assessment & Plan:   Problem List Items Addressed This Visit    Overweight (BMI 25.0-29.9)   Hypertension    Mild elevated BP Home reading reviewed No known complications   Plan:  1. Continue current BP regimen - HCTZ 25mg  daily, Losartan 100mg , Amlodipine 5mg  daily 2. Encourage improved lifestyle - low sodium diet, regular exercise 3. Continue monitor BP outside office, bring readings to next visit, if persistently >140/90 or new symptoms notify office sooner 4. Follow-up 6 months  May consider inc Amlodipine from 5 to 10 in future      Hyperlipidemia    Well controlled lipids now on statin Last lipid panel 02/2019 OFF Red Yeast [place]  Plan: 1. Continue Rosuvastatin 10mg  nightly - Continue Omega 3 1000mg  daily 2. Encourage improved lifestyle - low carb/cholesterol, reduce portion size, continue improving regular exercise      Elevated hemoglobin A1c - Primary    Stable A1c at 5.8 Improving diet Concern with HTN, HLD  Plan:  1. Not on any therapy currently  2. Encourage improved lifestyle - low carb, low sugar diet, reduce portion size, continue improving regular exercise      Relevant Orders   POCT HgB A1C (Completed)   Anxiety    Chronic problem Stable controlled on SSRI Clinically has some generalized anxiety  Plan Continue Sertraline 50mg  daily Follow-up - consider dose adjust or change in future if needed         No orders of the defined types were placed in this  encounter.     Follow up plan: Return in about 6 months (around 02/28/2020) for Annual Physical.  Future labs ordered for  02/23/20   Nobie Putnam, [facility] 08/28/2019, 8:38 AM

## 2019-08-29 NOTE — Assessment & Plan Note (Signed)
Well controlled lipids now on statin Last lipid panel 02/2019 OFF Red Yeast Rice  Plan: 1. Continue Rosuvastatin 10mg  nightly - Continue Omega 3 1000mg  daily 2. Encourage improved lifestyle - low carb/cholesterol, reduce portion size, continue improving regular exercise

## 2019-08-29 NOTE — Assessment & Plan Note (Signed)
Stable A1c at 5.8 Improving diet Concern with HTN, HLD  Plan:  1. Not on any therapy currently  2. Encourage improved lifestyle - low carb, low sugar diet, reduce portion size, continue improving regular exercise

## 2019-08-29 NOTE — Assessment & Plan Note (Signed)
Mild elevated BP Home reading reviewed No known complications   Plan:  1. Continue current BP regimen - HCTZ 25mg  daily, Losartan 100mg , Amlodipine 5mg  daily 2. Encourage improved lifestyle - low sodium diet, regular exercise 3. Continue monitor BP outside office, bring readings to next visit, if persistently >140/90 or new symptoms notify office sooner 4. Follow-up 6 months  May consider inc Amlodipine from 5 to 10 in future

## 2019-08-29 NOTE — Assessment & Plan Note (Signed)
Chronic problem Stable controlled on SSRI Clinically has some generalized anxiety  Plan Continue Sertraline 50mg  daily Follow-up - consider dose adjust or change in future if needed

## 2019-10-22 ENCOUNTER — Ambulatory Visit: Payer: PPO | Admitting: Dermatology

## 2019-10-22 ENCOUNTER — Other Ambulatory Visit: Payer: Self-pay

## 2019-10-22 DIAGNOSIS — L814 Other melanin hyperpigmentation: Secondary | ICD-10-CM

## 2019-10-22 DIAGNOSIS — L821 Other seborrheic keratosis: Secondary | ICD-10-CM

## 2019-10-22 DIAGNOSIS — L570 Actinic keratosis: Secondary | ICD-10-CM | POA: Diagnosis not present

## 2019-10-22 DIAGNOSIS — L578 Other skin changes due to chronic exposure to nonionizing radiation: Secondary | ICD-10-CM | POA: Diagnosis not present

## 2019-10-22 DIAGNOSIS — D229 Melanocytic nevi, unspecified: Secondary | ICD-10-CM | POA: Diagnosis not present

## 2019-10-22 DIAGNOSIS — D1800 Hemangioma unspecified site: Secondary | ICD-10-CM | POA: Diagnosis not present

## 2019-10-22 DIAGNOSIS — Z1283 Encounter for screening for malignant neoplasm of skin: Secondary | ICD-10-CM | POA: Diagnosis not present

## 2019-10-22 DIAGNOSIS — Z86018 Personal history of other benign neoplasm: Secondary | ICD-10-CM | POA: Diagnosis not present

## 2019-10-22 DIAGNOSIS — Z85828 Personal history of other malignant neoplasm of skin: Secondary | ICD-10-CM

## 2019-10-22 NOTE — Patient Instructions (Signed)
Cryotherapy Aftercare  . Wash gently with soap and water everyday.   . Apply Vaseline and Band-Aid daily until healed. Recommend daily broad spectrum sunscreen SPF 30+ to sun-exposed areas, reapply every 2 hours as needed. Call for new or changing lesions.  

## 2019-10-22 NOTE — Progress Notes (Signed)
   Follow-Up Visit   Subjective  Heidi Bell is a 69 y.o. female who presents for the following: Annual Exam. The patient presents for Total-Body Skin Exam (TBSE) for skin cancer screening and mole check.  Patient here today for TBSE. She does have a history of BCC and Dysplastic Nevi.   The following portions of the chart were reviewed this encounter and updated as appropriate:  Tobacco  Allergies  Meds  Problems  Med Hx  Surg Hx  Fam Hx     Review of Systems:  No other skin or systemic complaints except as noted in HPI or Assessment and Plan.  Objective  Well appearing patient in no apparent distress; mood and affect are within normal limits.  A full examination was performed including scalp, head, eyes, ears, nose, lips, neck, chest, axillae, abdomen, back, buttocks, bilateral upper extremities, bilateral lower extremities, hands, feet, fingers, toes, fingernails, and toenails. All findings within normal limits unless otherwise noted below.  Objective  Nose: Erythematous thin papules/macules with gritty scale.    Assessment & Plan    AK (actinic keratosis) Nose  Destruction of lesion - Nose Complexity: simple   Destruction method: cryotherapy   Informed consent: discussed and consent obtained   Timeout:  patient name, date of birth, surgical site, and procedure verified Lesion destroyed using liquid nitrogen: Yes   Region frozen until ice ball extended beyond lesion: Yes   Outcome: patient tolerated procedure well with no complications   Post-procedure details: wound care instructions given    Skin cancer screening   Lentigines - Scattered tan macules - Discussed due to sun exposure - Benign, observe - Call for any changes  Seborrheic Keratoses - Stuck-on, waxy, tan-brown papules and plaques face, trunk, extremities - Discussed benign etiology and prognosis. - Observe - Call for any changes  Melanocytic Nevi - Tan-brown and/or pink-flesh-colored  symmetric macules and papules - Benign appearing on exam today - Observation - Call clinic for new or changing moles - Recommend daily use of broad spectrum spf 30+ sunscreen to sun-exposed areas.   Hemangiomas - Red papules - Discussed benign nature - Observe - Call for any changes  Actinic Damage - diffuse scaly erythematous macules with underlying dyspigmentation - Recommend daily broad spectrum sunscreen SPF 30+ to sun-exposed areas, reapply every 2 hours as needed.  - Call for new or changing lesions.  Skin cancer screening performed today.  History of Basal Cell Carcinoma of the Skin - No evidence of recurrence today left cheek - Recommend regular full body skin exams - Recommend daily broad spectrum sunscreen SPF 30+ to sun-exposed areas, reapply every 2 hours as needed.  - Call if any new or changing lesions are noted between office visits  History of Dysplastic Nevi - No evidence of recurrence today near braline L mid lat back - Recommend regular full body skin exams - Recommend daily broad spectrum sunscreen SPF 30+ to sun-exposed areas, reapply every 2 hours as needed.  - Call if any new or changing lesions are noted between office visits  Return in about 1 year (around 10/21/2020) for TBSE.  Graciella Belton, RMA, am acting as scribe for Sarina Ser, MD . Documentation: I have reviewed the above documentation for accuracy and completeness, and I agree with the above.  Sarina Ser, MD

## 2019-10-25 ENCOUNTER — Encounter: Payer: Self-pay | Admitting: Dermatology

## 2019-10-27 ENCOUNTER — Ambulatory Visit: Payer: PPO | Admitting: Dermatology

## 2019-10-28 ENCOUNTER — Other Ambulatory Visit: Payer: Self-pay | Admitting: Family Medicine

## 2019-10-28 DIAGNOSIS — I10 Essential (primary) hypertension: Secondary | ICD-10-CM

## 2019-10-28 MED ORDER — HYDROCHLOROTHIAZIDE 25 MG PO TABS: 25.0000 mg | ORAL_TABLET | Freq: Every day | ORAL | 1 refills | Status: DC

## 2019-10-28 MED ORDER — LOSARTAN POTASSIUM 100 MG PO TABS: 100.0000 mg | ORAL_TABLET | Freq: Every day | ORAL | 1 refills | Status: DC

## 2019-10-28 MED ORDER — AMLODIPINE BESYLATE 5 MG PO TABS: 5.0000 mg | ORAL_TABLET | Freq: Every day | ORAL | 1 refills | Status: DC

## 2019-12-28 ENCOUNTER — Telehealth: Payer: Self-pay

## 2019-12-28 DIAGNOSIS — Z1211 Encounter for screening for malignant neoplasm of colon: Secondary | ICD-10-CM

## 2019-12-28 NOTE — Telephone Encounter (Signed)
Spoke with patient she wants to move forward with [facility]. Contact number provided, and she was told that someone from their office would be reaching out to her within 7 days and if not to call our office back. Referral placed in epic.

## 2020-02-22 ENCOUNTER — Other Ambulatory Visit: Payer: Self-pay | Admitting: *Deleted

## 2020-02-22 DIAGNOSIS — E782 Mixed hyperlipidemia: Secondary | ICD-10-CM

## 2020-02-22 DIAGNOSIS — I10 Essential (primary) hypertension: Secondary | ICD-10-CM

## 2020-02-22 DIAGNOSIS — E559 Vitamin D deficiency, unspecified: Secondary | ICD-10-CM

## 2020-02-22 DIAGNOSIS — Z0000 Encounter for general adult medical examination without abnormal findings: Secondary | ICD-10-CM

## 2020-02-22 DIAGNOSIS — E663 Overweight: Secondary | ICD-10-CM

## 2020-02-22 DIAGNOSIS — R7309 Other abnormal glucose: Secondary | ICD-10-CM

## 2020-02-23 ENCOUNTER — Other Ambulatory Visit: Payer: PPO

## 2020-02-23 DIAGNOSIS — E559 Vitamin D deficiency, unspecified: Secondary | ICD-10-CM | POA: Diagnosis not present

## 2020-02-23 DIAGNOSIS — I10 Essential (primary) hypertension: Secondary | ICD-10-CM | POA: Diagnosis not present

## 2020-02-23 DIAGNOSIS — E782 Mixed hyperlipidemia: Secondary | ICD-10-CM | POA: Diagnosis not present

## 2020-02-23 DIAGNOSIS — Z0000 Encounter for general adult medical examination without abnormal findings: Secondary | ICD-10-CM | POA: Diagnosis not present

## 2020-02-23 DIAGNOSIS — E663 Overweight: Secondary | ICD-10-CM | POA: Diagnosis not present

## 2020-02-23 DIAGNOSIS — R7309 Other abnormal glucose: Secondary | ICD-10-CM | POA: Diagnosis not present

## 2020-02-24 LAB — CBC WITH DIFFERENTIAL/PLATELET
Absolute Monocytes: 607 cells/uL (ref 200–950)
Basophils Absolute: 62 cells/uL (ref 0–200)
Basophils Relative: 0.9 %
Eosinophils Absolute: 90 cells/uL (ref 15–500)
Eosinophils Relative: 1.3 %
HCT: 39.2 % (ref 35.0–45.0)
Hemoglobin: 13 g/dL (ref 11.7–15.5)
Lymphs Abs: 1677 cells/uL (ref 850–3900)
MCH: 30.7 pg (ref 27.0–33.0)
MCHC: 33.2 g/dL (ref 32.0–36.0)
MCV: 92.5 fL (ref 80.0–100.0)
MPV: 11.7 fL (ref 7.5–12.5)
Monocytes Relative: 8.8 %
Neutro Abs: 4464 cells/uL (ref 1500–7800)
Neutrophils Relative %: 64.7 %
Platelets: 192 10*3/uL (ref 140–400)
RBC: 4.24 10*6/uL (ref 3.80–5.10)
RDW: 12.3 % (ref 11.0–15.0)
Total Lymphocyte: 24.3 %
WBC: 6.9 10*3/uL (ref 3.8–10.8)

## 2020-02-24 LAB — COMPLETE METABOLIC PANEL WITH GFR
AG Ratio: 1.7 (calc) (ref 1.0–2.5)
ALT: 15 U/L (ref 6–29)
AST: 23 U/L (ref 10–35)
Albumin: 4.5 g/dL (ref 3.6–5.1)
Alkaline phosphatase (APISO): 72 U/L (ref 37–153)
BUN: 22 mg/dL (ref 7–25)
CO2: 30 mmol/L (ref 20–32)
Calcium: 10.3 mg/dL (ref 8.6–10.4)
Chloride: 103 mmol/L (ref 98–110)
Creat: 0.62 mg/dL (ref 0.60–0.93)
GFR, Est African American: 105 mL/min/{1.73_m2} (ref >=60)
GFR, Est Non African American: 91 mL/min/{1.73_m2} (ref >=60)
Globulin: 2.6 g/dL (calc) (ref 1.9–3.7)
Glucose, Bld: 91 mg/dL (ref 65–99)
Potassium: 4 mmol/L (ref 3.5–5.3)
Sodium: 141 mmol/L (ref 135–146)
Total Bilirubin: 1.2 mg/dL (ref 0.2–1.2)
Total Protein: 7.1 g/dL (ref 6.1–8.1)

## 2020-02-24 LAB — LIPID PANEL
Cholesterol: 170 mg/dL (ref <200)
HDL: 77 mg/dL (ref >=50)
LDL Cholesterol (Calc): 75 mg/dL (calc)
Non-HDL Cholesterol (Calc): 93 mg/dL (calc) (ref <130)
Total CHOL/HDL Ratio: 2.2 (calc) (ref <5.0)
Triglycerides: 95 mg/dL (ref <150)

## 2020-02-24 LAB — HEMOGLOBIN A1C
Hgb A1c MFr Bld: 5.5 % of total Hgb (ref <5.7)
Mean Plasma Glucose: 111 (calc)
eAG (mmol/L): 6.2 (calc)

## 2020-02-24 LAB — TSH: TSH: 1.51 mIU/L (ref 0.40–4.50)

## 2020-02-24 LAB — VITAMIN D 25 HYDROXY (VIT D DEFICIENCY, FRACTURES): Vit D, 25-Hydroxy: 33 ng/mL (ref 30–100)

## 2020-02-29 ENCOUNTER — Ambulatory Visit (INDEPENDENT_AMBULATORY_CARE_PROVIDER_SITE_OTHER): Payer: PPO | Admitting: Family Medicine

## 2020-02-29 ENCOUNTER — Encounter: Payer: Self-pay | Admitting: Family Medicine

## 2020-02-29 ENCOUNTER — Other Ambulatory Visit: Payer: Self-pay

## 2020-02-29 DIAGNOSIS — R7309 Other abnormal glucose: Secondary | ICD-10-CM

## 2020-02-29 DIAGNOSIS — I10 Essential (primary) hypertension: Secondary | ICD-10-CM | POA: Diagnosis not present

## 2020-02-29 DIAGNOSIS — E782 Mixed hyperlipidemia: Secondary | ICD-10-CM | POA: Diagnosis not present

## 2020-02-29 NOTE — Assessment & Plan Note (Signed)
Home reading reviewed No known complications   Plan:  1. Continue current BP regimen - HCTZ 25mg  daily, Losartan 100mg , Amlodipine 5mg  daily 2. Encourage improved lifestyle - low sodium diet, regular exercise 3. Continue monitor BP outside office, bring readings to next visit, if persistently >140/90 or new symptoms notify office sooner 4. Follow-up 6 months  May consider inc Amlodipine from 5 to 10 in future

## 2020-02-29 NOTE — Assessment & Plan Note (Signed)
Well controlled lipids on statin Last lipid panel 02/2020 OFF Red Yeast Rice  Plan: 1. Continue Rosuvastatin 10mg  nightly - Continue Omega 3 1000mg  daily 2. Encourage improved lifestyle - low carb/cholesterol, reduce portion size, continue improving regular exercise

## 2020-02-29 NOTE — Assessment & Plan Note (Signed)
Improved A1c to 5.5 Improving diet Concern with HTN, HLD  Plan:  1. Not on any therapy currently  2. Encourage improved lifestyle - low carb, low sugar diet, reduce portion size, continue improving regular exercise

## 2020-02-29 NOTE — Patient Instructions (Addendum)
     Please schedule a Follow-up Appointment to: Return in about 6 months (around 08/28/2020) for 6 month follow-up PreDM A1c.  If you have any other questions or concerns, please feel free to call the office or send a message through [facility]. You may also schedule an earlier appointment if necessary.  Additionally, you may be receiving a survey about your experience at our office within a few days to 1 week by e-mail or mail. We value your feedback.  Nobie Putnam, DO [place]

## 2020-02-29 NOTE — Progress Notes (Signed)
Subjective:    Patient ID: Heidi Bell, female    DOB: 16-Aug-1950, 69 y.o.   MRN: [id number]  Heidi Bell is a 69 y.o. female presenting on 02/29/2020 for Hypertension  Virtual / Telehealth Encounter - telephone The purpose of this virtual visit is to provide medical care while limiting exposure to the novel coronavirus (COVID19) for both patient and office staff.  Consent was obtained for remote visit:  Yes.   Answered questions that patient had about telehealth interaction:  Yes.   I discussed the limitations, risks, security and privacy concerns of performing an evaluation and management service by video/telephone. I also discussed with the patient that there may be a patient responsible charge related to this service. The patient expressed understanding and agreed to proceed.  Patient Location: Home Provider Location: Heidi Bell (Office)  Participants in virtual visit: - Patient: Heidi Bell Client - Additional participants - Azalia Bilis - CMA: Frederich Cha, CMA - Provider: Dr Parks Ranger   HPI   Elevated A1c LastA1c up to 5.8, attributed to eating more sweets. Now down to A1c 5.5 with good results on lifestyle changes She attributes improvement to lifestyle changes. No history of DM Not checking Sugar Never on medication  HYPERLIPIDEMIA: Last lipid 02/2020, controlled LDL - Currently takingRosuvastatin 10mg  nightly , tolerating well without side effect or myalgia - On Fish Oil omega 3 1,000 mg daily Lifestyle Weight relatively stable - Diet:balanced dietreduced carb and sugar intake  CHRONIC HTN: Last home BP reading130-140s, before medicine. Usually checking with normal readings by report Current Meds -HCTZ 25mg  daily, Losartan 100mg , Amlodipine 5mg  daily - OFF Potassium supplement Reports good compliance, took meds today. Tolerating well, w/o complaints. Denies CP, dyspnea, HA, edema, dizziness /  lightheadedness  Vitamin D Deficiency Last lab was normal 33. Taking OTC Vitamin D  Chronic Anxiety Stable on Sertraline  History of Carpal Tunnel, bilateral Both hands with fingertip numbness and some cramping. She does repetitive activity with hands at times and reports worse in evening and when wake up - She wears gloves for compression some relief at time and also wearing wrist braces.   Health Maintenance: Declines all vaccines.  Depression screen [facility] 2/9 02/29/2020 08/28/2019 06/18/2019  Decreased Interest 0 0 0  Down, Depressed, Hopeless 0 0 0  PHQ - 2 Score 0 0 0  Altered sleeping - - -  Tired, decreased energy - - -  Change in appetite - - -  Feeling bad or failure about yourself  - - -  Trouble concentrating - - -  Moving slowly or fidgety/restless - - -  Suicidal thoughts - - -  PHQ-9 Score - - -  Difficult doing work/chores - - -    Past Medical History:  Diagnosis Date  . Anxiety   . Atypical mole 06/16/2018   L mid lat back near braline/mod  . Basal cell carcinoma 04/22/2018   L cheek/excision  . Hyperlipidemia   . Hypertension    Past Surgical History:  Procedure Laterality Date  . ABDOMINAL HYSTERECTOMY    . boil removed  1984  . BREAST CYST ASPIRATION Left 1994   Dr. Bary Castilla  . BREAST CYST ASPIRATION Left 10/2017  . COLONOSCOPY WITH PROPOFOL N/A 01/12/2015   Procedure: COLONOSCOPY WITH PROPOFOL;  Surgeon: Christene Lye, MD;  Location: [facility] ENDOSCOPY;  Service: Endoscopy;  Laterality: N/A;  . SKIN CANCER EXCISION     left shoulder, mid back and left cheek.    Social  History   Socioeconomic History  . Marital status: Single    Spouse name: Not on file  . Number of children: Not on file  . Years of education: Not on file  . Highest education level: High school graduate  Occupational History  . Occupation: retired  Tobacco Use  . Smoking status: Never Smoker  . Smokeless tobacco: Never Used  Vaping Use  . Vaping Use: Never used   Substance and Sexual Activity  . Alcohol use: No    Alcohol/week: 0.0 standard drinks  . Drug use: No  . Sexual activity: Not on file  Other Topics Concern  . Not on file  Social History Narrative  . Not on file   Social Determinants of Health   Financial Resource Strain: Low Risk   . Difficulty of Paying Living Expenses: Not hard at all  Food Insecurity: No Food Insecurity  . Worried About Charity fundraiser in the Last Year: Never true  . Ran Out of Food in the Last Year: Never true  Transportation Needs: No Transportation Needs  . Lack of Transportation (Medical): No  . Lack of Transportation (Non-Medical): No  Physical Activity: Inactive  . Days of Exercise per Week: 0 days  . Minutes of Exercise per Session: 0 min  Stress: No Stress Concern Present  . Feeling of Stress : Not at all  Social Connections: Moderately Isolated  . Frequency of Communication with Friends and Family: More than three times a week  . Frequency of Social Gatherings with Friends and Family: More than three times a week  . Attends Religious Services: More than 4 times per year  . Active Member of Clubs or Organizations: No  . Attends Archivist Meetings: Never  . Marital Status: Never married  Intimate Partner Violence: Not At Risk  . Fear of Current or Ex-Partner: No  . Emotionally Abused: No  . Physically Abused: No  . Sexually Abused: No   Family History  Problem Relation Age of Onset  . Colon cancer Father   . COPD Mother   . Lung cancer Mother   . Bleeding Disorder Mother   . Breast cancer Sister    Current Outpatient Medications on File Prior to Visit  Medication Sig  . amLODipine (NORVASC) 5 MG tablet Take 1 tablet (5 mg total) by mouth daily.  . Ascorbic Acid (VITAMIN C) 500 MG CAPS Take 500 mg by mouth daily. (Patient taking differently: Take 1,000 mg by mouth daily. )  . Calcium Carb-Cholecalciferol (CALCIUM PLUS VITAMIN D3) 600-800 MG-UNIT TABS Take by mouth.  .  Garlic 9381 MG CAPS Take by mouth 2 (two) times daily.   Marland Kitchen glucosamine-chondroitin 500-400 MG tablet Take 1 tablet by mouth 2 (two) times daily. (Patient taking differently: Take 1 tablet by mouth 2 (two) times daily. )  . hydrochlorothiazide (HYDRODIURIL) 25 MG tablet Take 1 tablet (25 mg total) by mouth daily.  Marland Kitchen losartan (COZAAR) 100 MG tablet Take 1 tablet (100 mg total) by mouth daily.  . Multiple Vitamins-Minerals (MULTIVITAMIN WITH MINERALS) tablet Take 1 tablet by mouth daily.  Marland Kitchen pyridOXINE (VITAMIN B-6) 100 MG tablet Take 100 mg by mouth daily.  . rosuvastatin (CRESTOR) 10 MG tablet Take 1 tablet (10 mg total) by mouth at bedtime.  . sertraline (ZOLOFT) 50 MG tablet Take 1 tablet (50 mg total) by mouth daily.   No current facility-administered medications on file prior to visit.    Review of Systems Per HPI unless specifically indicated  above      Objective:    There were no vitals taken for this visit.  Wt Readings from Last 3 Encounters:  08/28/19 140 lb 9.6 oz (63.8 kg)  03/02/19 147 lb (66.7 kg)  09/16/18 143 lb 9.6 oz (65.1 kg)    Physical Exam   Telephone encounter, no physical exam.   Results for orders placed or performed in visit on 02/22/20  VITAMIN D 25 Hydroxy (Vit-D Deficiency, Fractures)  Result Value Ref Range   Vit D, 25-Hydroxy 33 30 - 100 ng/mL  TSH  Result Value Ref Range   TSH 1.51 0.40 - 4.50 mIU/L  Lipid panel  Result Value Ref Range   Cholesterol 170 <200 mg/dL   HDL 77 > OR = 50 mg/dL   Triglycerides 95 <150 mg/dL   LDL Cholesterol (Calc) 75 mg/dL (calc)   Total CHOL/HDL Ratio 2.2 <5.0 (calc)   Non-HDL Cholesterol (Calc) 93 <130 mg/dL (calc)  COMPLETE METABOLIC PANEL WITH GFR  Result Value Ref Range   Glucose, Bld 91 65 - 99 mg/dL   BUN 22 7 - 25 mg/dL   Creat 0.62 0.60 - 0.93 mg/dL   GFR, Est Non African American 91 > OR = 60 mL/min/1.71m2   GFR, Est African American 105 > OR = 60 mL/min/1.73m2   BUN/Creatinine Ratio NOT APPLICABLE  6 - 22 (calc)   Sodium 141 135 - 146 mmol/L   Potassium 4.0 3.5 - 5.3 mmol/L   Chloride 103 98 - 110 mmol/L   CO2 30 20 - 32 mmol/L   Calcium 10.3 8.6 - 10.4 mg/dL   Total Protein 7.1 6.1 - 8.1 g/dL   Albumin 4.5 3.6 - 5.1 g/dL   Globulin 2.6 1.9 - 3.7 g/dL (calc)   AG Ratio 1.7 1.0 - 2.5 (calc)   Total Bilirubin 1.2 0.2 - 1.2 mg/dL   Alkaline phosphatase (APISO) 72 37 - 153 U/L   AST 23 10 - 35 U/L   ALT 15 6 - 29 U/L  CBC with Differential/Platelet  Result Value Ref Range   WBC 6.9 3.8 - 10.8 Thousand/uL   RBC 4.24 3.80 - 5.10 Million/uL   Hemoglobin 13.0 11.7 - 15.5 g/dL   HCT 39.2 35 - 45 %   MCV 92.5 80.0 - 100.0 fL   MCH 30.7 27.0 - 33.0 pg   MCHC 33.2 32.0 - 36.0 g/dL   RDW 12.3 11.0 - 15.0 %   Platelets 192 140 - 400 Thousand/uL   MPV 11.7 7.5 - 12.5 fL   Neutro Abs 4,464 1,500 - 7,800 cells/uL   Lymphs Abs 1,677 850 - 3,900 cells/uL   Absolute Monocytes 607 200 - 950 cells/uL   Eosinophils Absolute 90 15.0 - 500.0 cells/uL   Basophils Absolute 62 0.0 - 200.0 cells/uL   Neutrophils Relative % 64.7 %   Total Lymphocyte 24.3 %   Monocytes Relative 8.8 %   Eosinophils Relative 1.3 %   Basophils Relative 0.9 %  Hemoglobin A1c  Result Value Ref Range   Hgb A1c MFr Bld 5.5 <5.7 % of total Hgb   Mean Plasma Glucose 111 (calc)   eAG (mmol/L) 6.2 (calc)      Assessment & Plan:   Problem List Items Addressed This Visit    Hypertension    Home reading reviewed No known complications   Plan:  1. Continue current BP regimen - HCTZ 25mg  daily, Losartan 100mg , Amlodipine 5mg  daily 2. Encourage improved lifestyle - low sodium diet, regular exercise  3. Continue monitor BP outside office, bring readings to next visit, if persistently >140/90 or new symptoms notify office sooner 4. Follow-up 6 months  May consider inc Amlodipine from 5 to 10 in future      Hyperlipidemia    Well controlled lipids on statin Last lipid panel 02/2020 OFF [place] [place]  Plan: 1.  Continue Rosuvastatin 10mg  nightly - Continue Omega 3 1000mg  daily 2. Encourage improved lifestyle - low carb/cholesterol, reduce portion size, continue improving regular exercise      Elevated hemoglobin A1c - Primary    Improved A1c to 5.5 Improving diet Concern with HTN, HLD  Plan:  1. Not on any therapy currently  2. Encourage improved lifestyle - low carb, low sugar diet, reduce portion size, continue improving regular exercise         No orders of the defined types were placed in this encounter.     Follow up plan: Return in about 6 months (around 08/28/2020) for 6 month follow-up PreDM A1c.    Patient verbalizes understanding with the above medical recommendations including the limitation of remote medical advice.  Specific follow-up and call-back criteria were given for patient to follow-up or seek medical care more urgently if needed.  Total duration of direct patient care provided via telephone: 10 minutes   Nobie Putnam, [facility] 02/29/2020, 8:37 AM

## 2020-03-03 ENCOUNTER — Other Ambulatory Visit: Payer: Self-pay

## 2020-04-20 ENCOUNTER — Other Ambulatory Visit: Payer: Self-pay

## 2020-04-20 DIAGNOSIS — I10 Essential (primary) hypertension: Secondary | ICD-10-CM

## 2020-04-20 DIAGNOSIS — F419 Anxiety disorder, unspecified: Secondary | ICD-10-CM

## 2020-04-20 DIAGNOSIS — E782 Mixed hyperlipidemia: Secondary | ICD-10-CM

## 2020-04-20 MED ORDER — HYDROCHLOROTHIAZIDE 25 MG PO TABS: 25.0000 mg | ORAL_TABLET | Freq: Every day | ORAL | 1 refills | Status: DC

## 2020-04-20 MED ORDER — AMLODIPINE BESYLATE 5 MG PO TABS: 5.0000 mg | ORAL_TABLET | Freq: Every day | ORAL | 1 refills | Status: DC

## 2020-04-20 MED ORDER — LOSARTAN POTASSIUM 100 MG PO TABS: 100.0000 mg | ORAL_TABLET | Freq: Every day | ORAL | 1 refills | Status: DC

## 2020-04-20 MED ORDER — SERTRALINE HCL 50 MG PO TABS: 50.0000 mg | ORAL_TABLET | Freq: Every day | ORAL | 3 refills | Status: DC

## 2020-04-20 MED ORDER — ROSUVASTATIN CALCIUM 10 MG PO TABS: 10.0000 mg | ORAL_TABLET | Freq: Every day | ORAL | 3 refills | Status: DC

## 2020-05-30 ENCOUNTER — Telehealth: Payer: Self-pay | Admitting: Family Medicine

## 2020-05-30 NOTE — Telephone Encounter (Signed)
Copied from [place] [id number]. Topic: Medicare AWV >> May 30, 2020 12:13 PM Cher Nakai R wrote: Reason for CRM:  Left message for patient to call back and schedule the Medicare Annual Wellness Visit (AWV) virtually or by telephone.  Last AWV 09/02/2018  Please schedule at anytime with [facility].  40 minute appointment  Any questions, please call me at [phone]

## 2020-08-31 ENCOUNTER — Encounter: Payer: Self-pay | Admitting: Family Medicine

## 2020-08-31 ENCOUNTER — Ambulatory Visit (INDEPENDENT_AMBULATORY_CARE_PROVIDER_SITE_OTHER): Payer: PPO | Admitting: Family Medicine

## 2020-08-31 ENCOUNTER — Other Ambulatory Visit: Payer: Self-pay

## 2020-08-31 ENCOUNTER — Other Ambulatory Visit: Payer: Self-pay | Admitting: Family Medicine

## 2020-08-31 ENCOUNTER — Ambulatory Visit: Payer: PPO | Admitting: Family Medicine

## 2020-08-31 VITALS — BP 150/60 | HR 69 | Ht 62.5 in | Wt 140.2 lb

## 2020-08-31 DIAGNOSIS — R7309 Other abnormal glucose: Secondary | ICD-10-CM | POA: Diagnosis not present

## 2020-08-31 DIAGNOSIS — E782 Mixed hyperlipidemia: Secondary | ICD-10-CM

## 2020-08-31 DIAGNOSIS — Z0000 Encounter for general adult medical examination without abnormal findings: Secondary | ICD-10-CM

## 2020-08-31 DIAGNOSIS — I10 Essential (primary) hypertension: Secondary | ICD-10-CM

## 2020-08-31 DIAGNOSIS — E559 Vitamin D deficiency, unspecified: Secondary | ICD-10-CM

## 2020-08-31 LAB — POCT GLYCOSYLATED HEMOGLOBIN (HGB A1C): Hemoglobin A1C: 5.6 % (ref 4.0–5.6)

## 2020-08-31 MED ORDER — HYDROCHLOROTHIAZIDE 25 MG PO TABS: 25.0000 mg | ORAL_TABLET | Freq: Every day | ORAL | 1 refills | Status: DC

## 2020-08-31 MED ORDER — LOSARTAN POTASSIUM 100 MG PO TABS: 100.0000 mg | ORAL_TABLET | Freq: Every day | ORAL | 1 refills | Status: DC

## 2020-08-31 MED ORDER — AMLODIPINE BESYLATE 5 MG PO TABS: 5.0000 mg | ORAL_TABLET | Freq: Every day | ORAL | 1 refills | Status: DC

## 2020-08-31 NOTE — Assessment & Plan Note (Signed)
Stable A1c 5.6, prior 5.5 Improving diet Concern with HTN, HLD  Plan:  1. Not on any therapy currently  2. Encourage improved lifestyle - low carb, low sugar diet, reduce portion size, continue improving regular exercise

## 2020-08-31 NOTE — Assessment & Plan Note (Signed)
Elevated BP in office today, with anxiety/stress Home reading reviewed - normal readings No known complications   Plan:  1. Continue current BP regimen - HCTZ '25mg'$  daily, Losartan '100mg'$ , Amlodipine '5mg'$  daily 2. Encourage improved lifestyle - low sodium diet, regular exercise 3. Continue monitor BP outside office, bring readings to next visit, if persistently >140/90 or new symptoms notify office sooner 4. Follow-up 6 months  May consider inc Amlodipine from 5 to 10 in future

## 2020-08-31 NOTE — Patient Instructions (Addendum)
Thank you for coming to the office today.  Keep an eye on Blood Pressure.  A1c today.  DUE for FASTING BLOOD WORK (no food or drink after midnight before the lab appointment, only water or coffee without cream/sugar on the morning of)  SCHEDULE "Lab Only" visit in the morning at the clinic for lab draw in 6 MONTHS   - Make sure Lab Only appointment is at about 1 week before your next appointment, so that results will be available  For Lab Results, once available within 2-3 days of blood draw, you can can log in to MyChart online to view your results and a brief explanation. Also, we can discuss results at next follow-up visit.   Please schedule a Follow-up Appointment to: Return in about 6 months (around 03/02/2021) for 6 month fasting lab only then 1 week later Annual Physical.  If you have any other questions or concerns, please feel free to call the office or send a message through [place]. You may also schedule an earlier appointment if necessary.  Additionally, you may be receiving a survey about your experience at our office within a few days to 1 week by e-mail or mail. We value your feedback.  Nobie Putnam, DO [place]

## 2020-08-31 NOTE — Progress Notes (Signed)
Subjective:    Patient ID: Heidi Bell, female    DOB: 1951-01-18, 70 y.o.   MRN: [id number]  Heidi Bell is a 70 y.o. female presenting on 08/31/2020 for Prediabetes   HPI   Elevated A1c Prior A1c down to 5.5, but now admits eating more sweets. She attributes improvement to lifestyle changes. No history of DM Not checking Sugar Never on medication  CHRONIC HTN: Last home BP reading118-120s. Usually checking with normal readings by report Current Meds -HCTZ 25mg  daily, Losartan 100mg , Amlodipine 5mg  daily - OFF Potassium supplement Reports good compliance, took meds today. Tolerating well, w/o complaints. Admits occasional dizzy episode when forward bending, temporary episode. Denies CP, dyspnea, HA, edema, lightheadedness  Epistaxis Reports acute episode x 2 in past few months, seems to occur when bend forward only x 2 episodes mild. Self limited. She admits allergies.  Vitamin D Deficiency Last lab was normal 33. Taking OTC Vitamin D  Chronic Anxiety Stable on Sertraline Admits white coat hypertension   Depression screen [facility] 2/9 02/29/2020 08/28/2019 06/18/2019  Decreased Interest 0 0 0  Down, Depressed, Hopeless 0 0 0  PHQ - 2 Score 0 0 0  Altered sleeping - - -  Tired, decreased energy - - -  Change in appetite - - -  Feeling bad or failure about yourself  - - -  Trouble concentrating - - -  Moving slowly or fidgety/restless - - -  Suicidal thoughts - - -  PHQ-9 Score - - -  Difficult doing work/chores - - -    Social History   Tobacco Use  . Smoking status: Never Smoker  . Smokeless tobacco: Never Used  Vaping Use  . Vaping Use: Never used  Substance Use Topics  . Alcohol use: No    Alcohol/week: 0.0 standard drinks  . Drug use: No    Review of Systems Per HPI unless specifically indicated above     Objective:    BP (!) 150/60 (BP Location: Left Arm, Cuff Size: Normal)   Pulse 69   Ht 5' 2.5" (1.588 m)   Wt 140 lb 3.2 oz (63.6  kg)   SpO2 99%   BMI 25.23 kg/m   Wt Readings from Last 3 Encounters:  08/31/20 140 lb 3.2 oz (63.6 kg)  08/28/19 140 lb 9.6 oz (63.8 kg)  03/02/19 147 lb (66.7 kg)    Physical Exam Vitals and nursing note reviewed.  Constitutional:      General: She is not in acute distress.    Appearance: She is well-developed. She is not diaphoretic.     Comments: Well-appearing, comfortable, cooperative  HENT:     Head: Normocephalic and atraumatic.  Eyes:     General:        Right eye: No discharge.        Left eye: No discharge.     Conjunctiva/sclera: Conjunctivae normal.  Neck:     Thyroid: No thyromegaly.  Cardiovascular:     Rate and Rhythm: Normal rate and regular rhythm.     Heart sounds: Normal heart sounds. No murmur heard.   Pulmonary:     Effort: Pulmonary effort is normal. No respiratory distress.     Breath sounds: Normal breath sounds. No wheezing or rales.  Musculoskeletal:        General: Normal range of motion.     Cervical back: Normal range of motion and neck supple.  Lymphadenopathy:     Cervical: No cervical adenopathy.  Skin:  General: Skin is warm and dry.     Findings: No erythema or rash.  Neurological:     Mental Status: She is alert and oriented to person, place, and time.  Psychiatric:        Behavior: Behavior normal.     Comments: Well groomed, good eye contact, normal speech and thoughts     Recent Labs    02/23/20 0855 08/31/20 0852  HGBA1C 5.5 5.6     Results for orders placed or performed in visit on 08/31/20  POCT glycosylated hemoglobin (Hb A1C)  Result Value Ref Range   Hemoglobin A1C 5.6 4.0 - 5.6 %      Assessment & Plan:   Problem List Items Addressed This Visit    Essential hypertension - Primary    Elevated BP in office today, with anxiety/stress Home reading reviewed - normal readings No known complications   Plan:  1. Continue current BP regimen - HCTZ 25mg  daily, Losartan 100mg , Amlodipine 5mg  daily 2. Encourage  improved lifestyle - low sodium diet, regular exercise 3. Continue monitor BP outside office, bring readings to next visit, if persistently >140/90 or new symptoms notify office sooner 4. Follow-up 6 months  May consider inc Amlodipine from 5 to 10 in future      Relevant Medications   amLODipine (NORVASC) 5 MG tablet   hydrochlorothiazide (HYDRODIURIL) 25 MG tablet   losartan (COZAAR) 100 MG tablet   Elevated hemoglobin A1c    Stable A1c 5.6, prior 5.5 Improving diet Concern with HTN, HLD  Plan:  1. Not on any therapy currently  2. Encourage improved lifestyle - low carb, low sugar diet, reduce portion size, continue improving regular exercise      Relevant Orders   POCT glycosylated hemoglobin (Hb A1C) (Completed)     #Epistaxis Seems to be benign etiology, can be random from allergies and other factors Reassurance   Meds ordered this encounter  Medications  . amLODipine (NORVASC) 5 MG tablet    Sig: Take 1 tablet (5 mg total) by mouth daily.    Dispense:  90 tablet    Refill:  1  . hydrochlorothiazide (HYDRODIURIL) 25 MG tablet    Sig: Take 1 tablet (25 mg total) by mouth daily.    Dispense:  90 tablet    Refill:  1  . losartan (COZAAR) 100 MG tablet    Sig: Take 1 tablet (100 mg total) by mouth daily.    Dispense:  90 tablet    Refill:  1     Follow up plan: Return in about 6 months (around 03/02/2021) for 6 month fasting lab only then 1 week later Annual Physical.  Future labs ordered for 02/27/21   Nobie Putnam, [facility] 08/31/2020, 8:14 AM

## 2020-09-07 ENCOUNTER — Ambulatory Visit: Payer: PPO | Admitting: Family Medicine

## 2020-10-24 ENCOUNTER — Encounter: Payer: Self-pay | Admitting: Dermatology

## 2020-10-24 ENCOUNTER — Ambulatory Visit: Payer: PPO | Admitting: Dermatology

## 2020-10-24 ENCOUNTER — Other Ambulatory Visit: Payer: Self-pay

## 2020-10-24 DIAGNOSIS — Z1283 Encounter for screening for malignant neoplasm of skin: Secondary | ICD-10-CM

## 2020-10-24 DIAGNOSIS — L820 Inflamed seborrheic keratosis: Secondary | ICD-10-CM | POA: Diagnosis not present

## 2020-10-24 DIAGNOSIS — L821 Other seborrheic keratosis: Secondary | ICD-10-CM | POA: Diagnosis not present

## 2020-10-24 DIAGNOSIS — Z85828 Personal history of other malignant neoplasm of skin: Secondary | ICD-10-CM | POA: Diagnosis not present

## 2020-10-24 DIAGNOSIS — D229 Melanocytic nevi, unspecified: Secondary | ICD-10-CM

## 2020-10-24 DIAGNOSIS — L814 Other melanin hyperpigmentation: Secondary | ICD-10-CM | POA: Diagnosis not present

## 2020-10-24 DIAGNOSIS — L578 Other skin changes due to chronic exposure to nonionizing radiation: Secondary | ICD-10-CM | POA: Diagnosis not present

## 2020-10-24 DIAGNOSIS — Z86018 Personal history of other benign neoplasm: Secondary | ICD-10-CM

## 2020-10-24 DIAGNOSIS — D1800 Hemangioma unspecified site: Secondary | ICD-10-CM

## 2020-10-24 NOTE — Progress Notes (Signed)
Follow-Up Visit   Subjective  Heidi Bell is a 70 y.o. female who presents for the following: Annual Exam (Hx BCC - patient has noticed a crusted, scaly lesion on her right cheek that she would like checked today.). The patient presents for Total-Body Skin Exam (TBSE) for skin cancer screening and mole check.  The following portions of the chart were reviewed this encounter and updated as appropriate:   Tobacco  Allergies  Meds  Problems  Med Hx  Surg Hx  Fam Hx     Review of Systems:  No other skin or systemic complaints except as noted in HPI or Assessment and Plan.  Objective  Well appearing patient in no apparent distress; mood and affect are within normal limits.  A full examination was performed including scalp, head, eyes, ears, nose, lips, neck, chest, axillae, abdomen, back, buttocks, bilateral upper extremities, bilateral lower extremities, hands, feet, fingers, toes, fingernails, and toenails. All findings within normal limits unless otherwise noted below.  R cheek x 2 (2) Erythematous keratotic or waxy stuck-on papule or plaque.   Assessment & Plan  Inflamed seborrheic keratosis R cheek x 2  Destruction of lesion - R cheek x 2 Complexity: simple   Destruction method: cryotherapy   Informed consent: discussed and consent obtained   Timeout:  patient name, date of birth, surgical site, and procedure verified Lesion destroyed using liquid nitrogen: Yes   Region frozen until ice ball extended beyond lesion: Yes   Outcome: patient tolerated procedure well with no complications   Post-procedure details: wound care instructions given    Skin cancer screening  Lentigines - Scattered tan macules - Due to sun exposure - Benign-appering, observe - Recommend daily broad spectrum sunscreen SPF 30+ to sun-exposed areas, reapply every 2 hours as needed. - Call for any changes  Seborrheic Keratoses - Stuck-on, waxy, tan-brown papules and/or plaques  -  Benign-appearing - Discussed benign etiology and prognosis. - Observe - Call for any changes  Melanocytic Nevi - Tan-brown and/or pink-flesh-colored symmetric macules and papules - Benign appearing on exam today - Observation - Call clinic for new or changing moles - Recommend daily use of broad spectrum spf 30+ sunscreen to sun-exposed areas.   Hemangiomas - Red papules - Discussed benign nature - Observe - Call for any changes  Actinic Damage - Chronic condition, secondary to cumulative UV/sun exposure - diffuse scaly erythematous macules with underlying dyspigmentation - Recommend daily broad spectrum sunscreen SPF 30+ to sun-exposed areas, reapply every 2 hours as needed.  - Staying in the shade or wearing long sleeves, sun glasses (UVA+UVB protection) and wide brim hats (4-inch brim around the entire circumference of the hat) are also recommended for sun protection.  - Call for new or changing lesions.  History of Basal Cell Carcinoma of the Skin - No evidence of recurrence today - Recommend regular full body skin exams - Recommend daily broad spectrum sunscreen SPF 30+ to sun-exposed areas, reapply every 2 hours as needed.  - Call if any new or changing lesions are noted between office visits  History of Dysplastic Nevi - No evidence of recurrence today - Recommend regular full body skin exams - Recommend daily broad spectrum sunscreen SPF 30+ to sun-exposed areas, reapply every 2 hours as needed.  - Call if any new or changing lesions are noted between office visits  Skin cancer screening performed today.  Return in about 1 year (around 10/24/2021) for TBSE.  Luther Redo, CMA, am acting as  scribe for Sarina Ser, MD .  Documentation: I have reviewed the above documentation for accuracy and completeness, and I agree with the above.  Sarina Ser, MD

## 2020-10-24 NOTE — Patient Instructions (Signed)

## 2020-12-02 ENCOUNTER — Telehealth: Payer: Self-pay | Admitting: Family Medicine

## 2020-12-02 NOTE — Telephone Encounter (Signed)
Left message for patient to call back and schedule the Medicare Annual Wellness Visit (AWV) virtually or by telephone.  Last AWV 09/02/18  Please schedule at anytime with [place].  40 minute appointment  Any questions, please call me at [phone]

## 2021-02-22 ENCOUNTER — Other Ambulatory Visit: Payer: Self-pay

## 2021-02-22 DIAGNOSIS — Z0000 Encounter for general adult medical examination without abnormal findings: Secondary | ICD-10-CM

## 2021-02-22 DIAGNOSIS — R7309 Other abnormal glucose: Secondary | ICD-10-CM

## 2021-02-22 DIAGNOSIS — I10 Essential (primary) hypertension: Secondary | ICD-10-CM

## 2021-02-22 DIAGNOSIS — E782 Mixed hyperlipidemia: Secondary | ICD-10-CM

## 2021-02-22 DIAGNOSIS — E559 Vitamin D deficiency, unspecified: Secondary | ICD-10-CM

## 2021-02-27 ENCOUNTER — Other Ambulatory Visit: Payer: PPO

## 2021-02-27 DIAGNOSIS — Z0000 Encounter for general adult medical examination without abnormal findings: Secondary | ICD-10-CM | POA: Diagnosis not present

## 2021-02-27 DIAGNOSIS — E559 Vitamin D deficiency, unspecified: Secondary | ICD-10-CM | POA: Diagnosis not present

## 2021-02-27 DIAGNOSIS — E782 Mixed hyperlipidemia: Secondary | ICD-10-CM | POA: Diagnosis not present

## 2021-02-27 DIAGNOSIS — R7309 Other abnormal glucose: Secondary | ICD-10-CM | POA: Diagnosis not present

## 2021-02-27 DIAGNOSIS — I10 Essential (primary) hypertension: Secondary | ICD-10-CM | POA: Diagnosis not present

## 2021-02-28 LAB — HEMOGLOBIN A1C
Hgb A1c MFr Bld: 5.7 % of total Hgb — ABNORMAL HIGH (ref <5.7)
Mean Plasma Glucose: 117 mg/dL
eAG (mmol/L): 6.5 mmol/L

## 2021-02-28 LAB — COMPLETE METABOLIC PANEL WITH GFR
AG Ratio: 1.8 (calc) (ref 1.0–2.5)
ALT: 16 U/L (ref 6–29)
AST: 22 U/L (ref 10–35)
Albumin: 4.4 g/dL (ref 3.6–5.1)
Alkaline phosphatase (APISO): 65 U/L (ref 37–153)
BUN/Creatinine Ratio: 31 (calc) — ABNORMAL HIGH (ref 6–22)
BUN: 18 mg/dL (ref 7–25)
CO2: 32 mmol/L (ref 20–32)
Calcium: 9.5 mg/dL (ref 8.6–10.4)
Chloride: 104 mmol/L (ref 98–110)
Creat: 0.59 mg/dL — ABNORMAL LOW (ref 0.60–1.00)
Globulin: 2.4 g/dL (calc) (ref 1.9–3.7)
Glucose, Bld: 89 mg/dL (ref 65–99)
Potassium: 4 mmol/L (ref 3.5–5.3)
Sodium: 142 mmol/L (ref 135–146)
Total Bilirubin: 0.8 mg/dL (ref 0.2–1.2)
Total Protein: 6.8 g/dL (ref 6.1–8.1)
eGFR: 96 mL/min/{1.73_m2} (ref >=60)

## 2021-02-28 LAB — LIPID PANEL
Cholesterol: 143 mg/dL (ref <200)
HDL: 75 mg/dL (ref >=50)
LDL Cholesterol (Calc): 54 mg/dL (calc)
Non-HDL Cholesterol (Calc): 68 mg/dL (calc) (ref <130)
Total CHOL/HDL Ratio: 1.9 (calc) (ref <5.0)
Triglycerides: 59 mg/dL (ref <150)

## 2021-02-28 LAB — CBC WITH DIFFERENTIAL/PLATELET
Absolute Monocytes: 577 cells/uL (ref 200–950)
Basophils Absolute: 50 cells/uL (ref 0–200)
Basophils Relative: 0.8 %
Eosinophils Absolute: 87 cells/uL (ref 15–500)
Eosinophils Relative: 1.4 %
HCT: 38.2 % (ref 35.0–45.0)
Hemoglobin: 12.3 g/dL (ref 11.7–15.5)
Lymphs Abs: 1606 cells/uL (ref 850–3900)
MCH: 29.8 pg (ref 27.0–33.0)
MCHC: 32.2 g/dL (ref 32.0–36.0)
MCV: 92.5 fL (ref 80.0–100.0)
MPV: 11.9 fL (ref 7.5–12.5)
Monocytes Relative: 9.3 %
Neutro Abs: 3881 cells/uL (ref 1500–7800)
Neutrophils Relative %: 62.6 %
Platelets: 191 10*3/uL (ref 140–400)
RBC: 4.13 10*6/uL (ref 3.80–5.10)
RDW: 12.5 % (ref 11.0–15.0)
Total Lymphocyte: 25.9 %
WBC: 6.2 10*3/uL (ref 3.8–10.8)

## 2021-02-28 LAB — TSH: TSH: 2.02 mIU/L (ref 0.40–4.50)

## 2021-02-28 LAB — VITAMIN D 25 HYDROXY (VIT D DEFICIENCY, FRACTURES): Vit D, 25-Hydroxy: 32 ng/mL (ref 30–100)

## 2021-03-06 ENCOUNTER — Encounter: Payer: Self-pay | Admitting: Family Medicine

## 2021-03-06 ENCOUNTER — Ambulatory Visit (INDEPENDENT_AMBULATORY_CARE_PROVIDER_SITE_OTHER): Payer: PPO | Admitting: Family Medicine

## 2021-03-06 ENCOUNTER — Other Ambulatory Visit: Payer: Self-pay

## 2021-03-06 VITALS — BP 136/50 | HR 65 | Ht 62.5 in | Wt 143.0 lb

## 2021-03-06 DIAGNOSIS — E559 Vitamin D deficiency, unspecified: Secondary | ICD-10-CM

## 2021-03-06 DIAGNOSIS — D126 Benign neoplasm of colon, unspecified: Secondary | ICD-10-CM

## 2021-03-06 DIAGNOSIS — Z0000 Encounter for general adult medical examination without abnormal findings: Secondary | ICD-10-CM | POA: Diagnosis not present

## 2021-03-06 DIAGNOSIS — Z1231 Encounter for screening mammogram for malignant neoplasm of breast: Secondary | ICD-10-CM | POA: Diagnosis not present

## 2021-03-06 DIAGNOSIS — E782 Mixed hyperlipidemia: Secondary | ICD-10-CM | POA: Diagnosis not present

## 2021-03-06 DIAGNOSIS — Z1211 Encounter for screening for malignant neoplasm of colon: Secondary | ICD-10-CM

## 2021-03-06 DIAGNOSIS — R7309 Other abnormal glucose: Secondary | ICD-10-CM

## 2021-03-06 DIAGNOSIS — F419 Anxiety disorder, unspecified: Secondary | ICD-10-CM | POA: Diagnosis not present

## 2021-03-06 DIAGNOSIS — I10 Essential (primary) hypertension: Secondary | ICD-10-CM | POA: Diagnosis not present

## 2021-03-06 NOTE — Progress Notes (Signed)
Subjective:    Patient ID: Heidi Bell, female    DOB: 17-Feb-1951, 70 y.o.   MRN: [id number]  Heidi Bell is a 70 y.o. female presenting on 03/06/2021 for Annual Exam   HPI  Here for Annual Physical and Lab Review.  Elevated A1c / Pre-Diabetes Last A1c 5.7, stable from past 3 years, 5.5 to 5.8 She attributes improvement to lifestyle changes. No history of DM Not checking Sugar Never on medication   CHRONIC HTN: Last home BP reading 118-120s. Usually checking with normal readings by report Current Meds - HCTZ 66m daily, Losartan 1054m Amlodipine 23m47maily - OFF Potassium supplement Reports good compliance, took meds today. Tolerating well, w/o complaints. Admits occasional dizzy episode when forward bending, temporary episode. Denies CP, dyspnea, HA, edema, lightheadedness   Epistaxis Reports acute episode x 2 in past few months, seems to occur when bend forward only x 2 episodes mild. Self limited. She admits allergies.   Vitamin D Deficiency Last lab was normal 33. Taking OTC Vitamin D   Chronic Anxiety Stable on Sertraline Admits white coat hypertension   Additional concerns:  R Ear hearing reduced / wax  R Knee Instability / Pain Reports she was dx with arthritis in R knee years ago with swelling and saw orthopedic with injection with resolution and problem was gone. She has remained active, now recently describes some episodic flares with walking and bending she will have a instant moment of R knee instability and some associated pain, feels like knee "gives way" and then comes back and causes some discomfort. Not using knee brace or sleeve. Tried topical.  Epistaxis - rare occurrence.   Health Maintenance:  Declines all vaccines  Due for Colonoscopy - last done Dr SanJamal Collin/2016, [facility] Now due for repeat, after 5 years +  Mammogram due, now, last 2020. Ordered today. She will schedule after 04/02/21   [facility]  03/06/2021 02/29/2020 08/28/2019  Decreased Interest 0 0 0  Down, Depressed, Hopeless 0 0 0  PHQ - 2 Score 0 0 0  Altered sleeping 0 - -  Tired, decreased energy 0 - -  Change in appetite 0 - -  Feeling bad or failure about yourself  0 - -  Trouble concentrating 0 - -  Moving slowly or fidgety/restless 0 - -  Suicidal thoughts 0 - -  PHQ-9 Score 0 - -  Difficult doing work/chores Not difficult at all - -  Some recent data might be hidden    Past Medical History:  Diagnosis Date   Anxiety    Atypical mole 06/16/2018   L mid lat back near braline/mod   Basal cell carcinoma 04/22/2018   L cheek/excision   Hyperlipidemia    Hypertension    Past Surgical History:  Procedure Laterality Date   ABDOMINAL HYSTERECTOMY     boil removed  1984   BREAST CYST ASPIRATION Left 1994   Dr. ByrBary CastillaBREAST CYST ASPIRATION Left 10/2017   COLONOSCOPY WITH PROPOFOL N/A 01/12/2015   Procedure: COLONOSCOPY WITH PROPOFOL;  Surgeon: SeeChristene LyeD;  Location: [facility] ENDOSCOPY;  Service: Endoscopy;  Laterality: N/A;   SKIN CANCER EXCISION     left shoulder, mid back and left cheek.    Social History   Socioeconomic History   Marital status: Single    Spouse name: Not on file   Number of children: Not on file   Years of education: Not on file   Highest  education level: High school graduate  Occupational History   Occupation: retired  Tobacco Use   Smoking status: Never   Smokeless tobacco: Never  Vaping Use   Vaping Use: Never used  Substance and Sexual Activity   Alcohol use: No    Alcohol/week: 0.0 standard drinks   Drug use: No   Sexual activity: Not on file  Other Topics Concern   Not on file  Social History Narrative   Not on file   Social Determinants of Health   Financial Resource Strain: Not on file  Food Insecurity: Not on file  Transportation Needs: Not on file  Physical Activity: Not on file  Stress: Not on file  Social Connections: Not on file  Intimate  Partner Violence: Not on file   Family History  Problem Relation Age of Onset   Colon cancer Father    COPD Mother    Lung cancer Mother    Bleeding Disorder Mother    Breast cancer Sister    Current Outpatient Medications on File Prior to Visit  Medication Sig   amLODipine (NORVASC) 5 MG tablet Take 1 tablet (5 mg total) by mouth daily.   Ascorbic Acid (VITAMIN C) 500 MG CAPS Take 500 mg by mouth daily. (Patient taking differently: Take 1,000 mg by mouth daily.)   Calcium Carb-Cholecalciferol 600-800 MG-UNIT TABS Take by mouth.   Garlic 4166 MG CAPS Take by mouth 2 (two) times daily.    glucosamine-chondroitin 500-400 MG tablet Take 1 tablet by mouth 2 (two) times daily. (Patient taking differently: Take 1 tablet by mouth 2 (two) times daily.)   hydrochlorothiazide (HYDRODIURIL) 25 MG tablet Take 1 tablet (25 mg total) by mouth daily.   losartan (COZAAR) 100 MG tablet Take 1 tablet (100 mg total) by mouth daily.   Multiple Vitamins-Minerals (MULTIVITAMIN WITH MINERALS) tablet Take 1 tablet by mouth daily.   pyridOXINE (VITAMIN B-6) 100 MG tablet Take 100 mg by mouth daily.   rosuvastatin (CRESTOR) 10 MG tablet Take 1 tablet (10 mg total) by mouth at bedtime.   sertraline (ZOLOFT) 50 MG tablet Take 1 tablet (50 mg total) by mouth daily.   No current facility-administered medications on file prior to visit.    Review of Systems  Constitutional:  Negative for activity change, appetite change, chills, diaphoresis, fatigue and fever.  HENT:  Negative for congestion and hearing loss.   Eyes:  Negative for visual disturbance.  Respiratory:  Negative for cough, chest tightness, shortness of breath and wheezing.   Cardiovascular:  Negative for chest pain, palpitations and leg swelling.  Gastrointestinal:  Negative for abdominal pain, constipation, diarrhea, nausea and vomiting.  Genitourinary:  Negative for dysuria, frequency and hematuria.  Musculoskeletal:  Negative for arthralgias and  neck pain.  Skin:  Negative for rash.  Neurological:  Negative for dizziness, weakness, light-headedness, numbness and headaches.  Hematological:  Negative for adenopathy.  Psychiatric/Behavioral:  Negative for behavioral problems, dysphoric mood and sleep disturbance.   Per HPI unless specifically indicated above     Objective:    BP (!) 136/50 (BP Location: Left Arm, Cuff Size: Normal)   Pulse 65   Ht 5' 2.5" (1.588 m)   Wt 143 lb (64.9 kg)   BMI 25.74 kg/m   Wt Readings from Last 3 Encounters:  03/06/21 143 lb (64.9 kg)  08/31/20 140 lb 3.2 oz (63.6 kg)  08/28/19 140 lb 9.6 oz (63.8 kg)    Physical Exam Vitals and nursing note reviewed.  Constitutional:  General: She is not in acute distress.    Appearance: She is well-developed. She is not diaphoretic.     Comments: Well-appearing, comfortable, cooperative  HENT:     Head: Normocephalic and atraumatic.  Eyes:     General:        Right eye: No discharge.        Left eye: No discharge.     Conjunctiva/sclera: Conjunctivae normal.     Pupils: Pupils are equal, round, and reactive to light.  Neck:     Thyroid: No thyromegaly.  Cardiovascular:     Rate and Rhythm: Normal rate and regular rhythm.     Pulses: Normal pulses.     Heart sounds: Normal heart sounds. No murmur heard. Pulmonary:     Effort: Pulmonary effort is normal. No respiratory distress.     Breath sounds: Normal breath sounds. No wheezing or rales.  Abdominal:     General: Bowel sounds are normal. There is no distension.     Palpations: Abdomen is soft. There is no mass.     Tenderness: There is no abdominal tenderness.  Musculoskeletal:        General: No tenderness. Normal range of motion.     Cervical back: Normal range of motion and neck supple.     Right lower leg: No edema.     Left lower leg: No edema.     Comments: Upper / Lower Extremities: - Normal muscle tone, strength bilateral upper extremities 5/5, lower extremities 5/5  Standing  Thessaly with some R knee instability  Lymphadenopathy:     Cervical: No cervical adenopathy.  Skin:    General: Skin is warm and dry.     Findings: No erythema or rash.  Neurological:     Mental Status: She is alert and oriented to person, place, and time.     Comments: Distal sensation intact to light touch all extremities  Psychiatric:        Mood and Affect: Mood normal.        Behavior: Behavior normal.        Thought Content: Thought content normal.     Comments: Well groomed, good eye contact, normal speech and thoughts     Results for orders placed or performed in visit on 02/22/21  TSH  Result Value Ref Range   TSH 2.02 0.40 - 4.50 mIU/L  VITAMIN D 25 Hydroxy (Vit-D Deficiency, Fractures)  Result Value Ref Range   Vit D, 25-Hydroxy 32 30 - 100 ng/mL  Lipid panel  Result Value Ref Range   Cholesterol 143 <200 mg/dL   HDL 75 > OR = 50 mg/dL   Triglycerides 59 <150 mg/dL   LDL Cholesterol (Calc) 54 mg/dL (calc)   Total CHOL/HDL Ratio 1.9 <5.0 (calc)   Non-HDL Cholesterol (Calc) 68 <130 mg/dL (calc)  COMPLETE METABOLIC PANEL WITH GFR  Result Value Ref Range   Glucose, Bld 89 65 - 99 mg/dL   BUN 18 7 - 25 mg/dL   Creat 0.59 (L) 0.60 - 1.00 mg/dL   eGFR 96 > OR = 60 mL/min/1.73m   BUN/Creatinine Ratio 31 (H) 6 - 22 (calc)   Sodium 142 135 - 146 mmol/L   Potassium 4.0 3.5 - 5.3 mmol/L   Chloride 104 98 - 110 mmol/L   CO2 32 20 - 32 mmol/L   Calcium 9.5 8.6 - 10.4 mg/dL   Total Protein 6.8 6.1 - 8.1 g/dL   Albumin 4.4 3.6 - 5.1 g/dL   Globulin  2.4 1.9 - 3.7 g/dL (calc)   AG Ratio 1.8 1.0 - 2.5 (calc)   Total Bilirubin 0.8 0.2 - 1.2 mg/dL   Alkaline phosphatase (APISO) 65 37 - 153 U/L   AST 22 10 - 35 U/L   ALT 16 6 - 29 U/L  CBC with Differential/Platelet  Result Value Ref Range   WBC 6.2 3.8 - 10.8 Thousand/uL   RBC 4.13 3.80 - 5.10 Million/uL   Hemoglobin 12.3 11.7 - 15.5 g/dL   HCT 38.2 35.0 - 45.0 %   MCV 92.5 80.0 - 100.0 fL   MCH 29.8 27.0 - 33.0 pg    MCHC 32.2 32.0 - 36.0 g/dL   RDW 12.5 11.0 - 15.0 %   Platelets 191 140 - 400 Thousand/uL   MPV 11.9 7.5 - 12.5 fL   Neutro Abs 3,881 1,500 - 7,800 cells/uL   Lymphs Abs 1,606 850 - 3,900 cells/uL   Absolute Monocytes 577 200 - 950 cells/uL   Eosinophils Absolute 87 15 - 500 cells/uL   Basophils Absolute 50 0 - 200 cells/uL   Neutrophils Relative % 62.6 %   Total Lymphocyte 25.9 %   Monocytes Relative 9.3 %   Eosinophils Relative 1.4 %   Basophils Relative 0.8 %  Hemoglobin A1c  Result Value Ref Range   Hgb A1c MFr Bld 5.7 (H) <5.7 % of total Hgb   Mean Plasma Glucose 117 mg/dL   eAG (mmol/L) 6.5 mmol/L      Assessment & Plan:   Problem List Items Addressed This Visit     Vitamin D deficiency   Hyperlipidemia   Essential hypertension   Elevated hemoglobin A1c   Anxiety   Other Visit Diagnoses     Annual physical exam    -  Primary   Encounter for screening mammogram for malignant neoplasm of breast       Relevant Orders   MM 3D SCREEN BREAST BILATERAL   Adenomatous polyp of colon, unspecified part of colon       Relevant Orders   Ambulatory referral to General Surgery   Screening for colon cancer       Relevant Orders   Ambulatory referral to General Surgery       Updated Health Maintenance information Referral to Gen Surgery for routine Colonoscopy screening, last 5 years ago. Ordered Mammogram [place] - she will schedule in 2023. Declines vaccines Reviewed recent lab results with patient Encouraged improvement to lifestyle with diet and exercise Goal of weight loss  PreDM A1c 5.7 controlled Encourage lifestyle  HLD Controlled on Statin Rosuvastatin 68m  HTN Repeat reading Controlled on medication  Knee Instability Suspect may have meniscus issue chronic problem causing instability Recommend Knee Sleeve support Conservative care, return if need x-ray and ortho  Ear wax, return for ear cleaning   Orders Placed This Encounter  Procedures    MM 3D SCREEN BREAST BILATERAL    Standing Status:   Future    Standing Expiration Date:   03/06/2022    Order Specific Question:   Reason for Exam (SYMPTOM  OR DIAGNOSIS REQUIRED)    Answer:   Screening bilateral 3D Mammogram [place]    Order Specific Question:   Pref[place]?    [place] [facility]  Ambulatory referral to General Surgery    Referral Priority:   Routine    Referral Type:   Surgical    Referral Reason:   Specialty Services Required    Requested Specialty:  General Surgery    Number of Visits Requested:   1      No orders of the defined types were placed in this encounter.     Follow up plan: Return if symptoms worsen or fail to improve, for ear wax removal/cleaning next available. Then routine 6 month Follow-up PreDM A1c, HTN.  Nobie Putnam, [facility] 03/06/2021, 8:33 AM

## 2021-03-06 NOTE — Patient Instructions (Addendum)
Thank you for coming to the office today.  Ear wax both sides, we can clean it for you next time  BP improved.  Use Compression Knee Sleeve / ACE for support as well. To present it giving way  START anti inflammatory topical - OTC Voltaren (generic Diclofenac) topical 2-4 times a day as needed for pain swelling of affected joint for 1-2 weeks or longer.  Recommend to start taking Tylenol Extra Strength 500mg  tabs - take 1 to 2 tabs per dose (max 1000mg ) every 6-8 hours for pain (take regularly, don't skip a dose for next 7 days), max 24 hour daily dose is 6 tablets or 3000mg . In the future you can repeat the same everyday Tylenol course for 1-2 weeks at a time.   --- if not improving can do X-ray and Orthopedic or injection  -------------------------  For Mammogram screening for breast cancer   Call the [place] below anytime to schedule your own appointment now that order has been placed.  [facility] [place], [place] [address] Phone: [phone]  ------  Colonoscopy  [facility] [address] [place],  [place]  [address] Phone: [phone]   Please schedule a Follow-up Appointment to: Return if symptoms worsen or fail to improve, for ear wax removal/cleaning next available. Then routine 6 month Follow-up PreDM A1c, HTN.  If you have any other questions or concerns, please feel free to call the office or send a message through [place]. You may also schedule an earlier appointment if necessary.  Additionally, you may be receiving a survey about your experience at our office within a few days to 1 week by e-mail or mail. We value your feedback.  Nobie Putnam, DO [place]

## 2021-03-13 ENCOUNTER — Telehealth: Payer: Self-pay

## 2021-03-13 NOTE — Telephone Encounter (Signed)
Returned patients sister call. LVM to call office back.

## 2021-03-13 NOTE — Telephone Encounter (Signed)
Patient is ready to schedule procedure. Clinical staff will follow up with patient.//call sister to schedule Heidi Bell-[phone](she's on the [facility] [employer])

## 2021-03-14 ENCOUNTER — Encounter: Payer: Self-pay | Admitting: Family Medicine

## 2021-03-14 ENCOUNTER — Ambulatory Visit (INDEPENDENT_AMBULATORY_CARE_PROVIDER_SITE_OTHER): Payer: PPO | Admitting: Family Medicine

## 2021-03-14 ENCOUNTER — Other Ambulatory Visit: Payer: Self-pay

## 2021-03-14 VITALS — BP 137/49 | HR 66 | Ht 62.5 in | Wt 141.8 lb

## 2021-03-14 DIAGNOSIS — H6123 Impacted cerumen, bilateral: Secondary | ICD-10-CM | POA: Diagnosis not present

## 2021-03-14 NOTE — Progress Notes (Signed)
Subjective:    Patient ID: Heidi Bell, female    DOB: 1950-09-14, 70 y.o.   MRN: [id number]  Heidi Bell is a 70 y.o. female presenting on 03/14/2021 for Ear Fullness   HPI  Bilateral Cerumen Impaction Reduced Hearing Chronic problem now lately for weeks+ months with hearing reduced, ear fullness bilateral. Ear wax build up request cleaning. Tried home kit without success   Depression screen [facility] 2/9 03/06/2021 02/29/2020 08/28/2019  Decreased Interest 0 0 0  Down, Depressed, Hopeless 0 0 0  PHQ - 2 Score 0 0 0  Altered sleeping 0 - -  Tired, decreased energy 0 - -  Change in appetite 0 - -  Feeling bad or failure about yourself  0 - -  Trouble concentrating 0 - -  Moving slowly or fidgety/restless 0 - -  Suicidal thoughts 0 - -  PHQ-9 Score 0 - -  Difficult doing work/chores Not difficult at all - -  Some recent data might be hidden    Social History   Tobacco Use   Smoking status: Never   Smokeless tobacco: Never  Vaping Use   Vaping Use: Never used  Substance Use Topics   Alcohol use: No    Alcohol/week: 0.0 standard drinks   Drug use: No    Review of Systems Per HPI unless specifically indicated above     Objective:    BP (!) 137/49    Pulse 66    Ht 5' 2.5" (1.588 m)    Wt 141 lb 12.8 oz (64.3 kg)    SpO2 97%    BMI 25.52 kg/m   Wt Readings from Last 3 Encounters:  03/14/21 141 lb 12.8 oz (64.3 kg)  03/06/21 143 lb (64.9 kg)  08/31/20 140 lb 3.2 oz (63.6 kg)    Physical Exam Vitals and nursing note reviewed.  Constitutional:      General: She is not in acute distress.    Appearance: Normal appearance. She is well-developed. She is not diaphoretic.     Comments: Well-appearing, comfortable, cooperative  HENT:     Head: Normocephalic and atraumatic.     Right Ear: There is impacted cerumen.     Left Ear: There is impacted cerumen.  Eyes:     General:        Right eye: No discharge.        Left eye: No discharge.     Conjunctiva/sclera:  Conjunctivae normal.  Cardiovascular:     Rate and Rhythm: Normal rate.  Pulmonary:     Effort: Pulmonary effort is normal.  Skin:    General: Skin is warm and dry.     Findings: No erythema or rash.  Neurological:     Mental Status: She is alert and oriented to person, place, and time.  Psychiatric:        Mood and Affect: Mood normal.        Behavior: Behavior normal.        Thought Content: Thought content normal.     Comments: Well groomed, good eye contact, normal speech and thoughts    ________________________________________________________ PROCEDURE NOTE Date: 03/14/21 Bilateral Ear Lavage / Cerumen Removal Discussed benefits and risks (including pain / discomforts, dizziness, minor abrasion of ear canal). Verbal consent given by patient. Medication:  carbamide peroxide ear drops, Ear Lavage Solution (warm water + hydrogen peroxide) Performed by Dr Parks Ranger Several drops of carbamide peroxide placed in each ear, allowed to sit for few minutes. Ear lavage  solution flushed into one ear at a time in attempt to dislodge and remove ear wax. Results were successful with complete resolution of wax impaction  Repeat Ear Exam: - Completely removed cerumen now, with clear ear canals and visible TMs clear and normal appearance.    Results for orders placed or performed in visit on 02/22/21  TSH  Result Value Ref Range   TSH 2.02 0.40 - 4.50 mIU/L  VITAMIN D 25 Hydroxy (Vit-D Deficiency, Fractures)  Result Value Ref Range   Vit D, 25-Hydroxy 32 30 - 100 ng/mL  Lipid panel  Result Value Ref Range   Cholesterol 143 <200 mg/dL   HDL 75 > OR = 50 mg/dL   Triglycerides 59 <150 mg/dL   LDL Cholesterol (Calc) 54 mg/dL (calc)   Total CHOL/HDL Ratio 1.9 <5.0 (calc)   Non-HDL Cholesterol (Calc) 68 <130 mg/dL (calc)  COMPLETE METABOLIC PANEL WITH GFR  Result Value Ref Range   Glucose, Bld 89 65 - 99 mg/dL   BUN 18 7 - 25 mg/dL   Creat 0.59 (L) 0.60 - 1.00 mg/dL   eGFR 96 > OR =  60 mL/min/1.70m   BUN/Creatinine Ratio 31 (H) 6 - 22 (calc)   Sodium 142 135 - 146 mmol/L   Potassium 4.0 3.5 - 5.3 mmol/L   Chloride 104 98 - 110 mmol/L   CO2 32 20 - 32 mmol/L   Calcium 9.5 8.6 - 10.4 mg/dL   Total Protein 6.8 6.1 - 8.1 g/dL   Albumin 4.4 3.6 - 5.1 g/dL   Globulin 2.4 1.9 - 3.7 g/dL (calc)   AG Ratio 1.8 1.0 - 2.5 (calc)   Total Bilirubin 0.8 0.2 - 1.2 mg/dL   Alkaline phosphatase (APISO) 65 37 - 153 U/L   AST 22 10 - 35 U/L   ALT 16 6 - 29 U/L  CBC with Differential/Platelet  Result Value Ref Range   WBC 6.2 3.8 - 10.8 Thousand/uL   RBC 4.13 3.80 - 5.10 Million/uL   Hemoglobin 12.3 11.7 - 15.5 g/dL   HCT 38.2 35.0 - 45.0 %   MCV 92.5 80.0 - 100.0 fL   MCH 29.8 27.0 - 33.0 pg   MCHC 32.2 32.0 - 36.0 g/dL   RDW 12.5 11.0 - 15.0 %   Platelets 191 140 - 400 Thousand/uL   MPV 11.9 7.5 - 12.5 fL   Neutro Abs 3,881 1,500 - 7,800 cells/uL   Lymphs Abs 1,606 850 - 3,900 cells/uL   Absolute Monocytes 577 200 - 950 cells/uL   Eosinophils Absolute 87 15 - 500 cells/uL   Basophils Absolute 50 0 - 200 cells/uL   Neutrophils Relative % 62.6 %   Total Lymphocyte 25.9 %   Monocytes Relative 9.3 %   Eosinophils Relative 1.4 %   Basophils Relative 0.8 %  Hemoglobin A1c  Result Value Ref Range   Hgb A1c MFr Bld 5.7 (H) <5.7 % of total Hgb   Mean Plasma Glucose 117 mg/dL   eAG (mmol/L) 6.5 mmol/L      Assessment & Plan:   Problem List Items Addressed This Visit   None Visit Diagnoses     Impacted cerumen of both ears    -  Primary       Significant amount of large thick impacted cerumen bilaterally ear, suspected primary cause of current reduced bilateral hearing  Plan: 1. Successful office ear lavage cerumen removal today, re-evaluated with clear ear canals and normal TMs Follow-up as needed   No orders  of the defined types were placed in this encounter.     Follow up plan: Return if symptoms worsen or fail to improve.  Nobie Putnam,  [facility] 03/14/2021, 11:03 AM

## 2021-03-14 NOTE — Patient Instructions (Signed)
° °  Please schedule a Follow-up Appointment to: No follow-ups on file. ° °If you have any other questions or concerns, please feel free to call the office or send a message through MyChart. You may also schedule an earlier appointment if necessary. ° °Additionally, you may be receiving a survey about your experience at our office within a few days to 1 week by e-mail or mail. We value your feedback. ° °Tylynn Braniff, DO °[facility] °

## 2021-03-15 ENCOUNTER — Telehealth: Payer: Self-pay

## 2021-03-15 NOTE — Telephone Encounter (Signed)
Returned call to sister. LVM to call the office back.

## 2021-03-21 ENCOUNTER — Other Ambulatory Visit: Payer: Self-pay

## 2021-03-21 DIAGNOSIS — Z86010 Personal history of colonic polyps: Secondary | ICD-10-CM

## 2021-03-21 MED ORDER — NA SULFATE-K SULFATE-MG SULF 17.5-3.13-1.6 GM/177ML PO SOLN
1.0000 | Freq: Once | ORAL | 0 refills | Status: AC
Start: 2021-03-21 — End: 2021-03-21

## 2021-03-21 NOTE — Progress Notes (Signed)
Gastroenterology Pre-Procedure Review  Request Date: 04/06/2021 Requesting Physician: Dr. Allen Norris  PATIENT REVIEW QUESTIONS: The patient responded to the following health history questions as indicated:  Gaynelle (sister) answered the questions regarding patient.  1. Are you having any GI issues? no 2. Do you have a personal history of Polyps? yes (01/12/2015 polyps removed) 3. Do you have a family history of Colon Cancer or Polyps? yes (uncle- colon cancer) 4. Diabetes Mellitus? no 5. Joint replacements in the past 12 months?no 6. Major health problems in the past 3 months?no 7. Any artificial heart valves, MVP, or defibrillator?no    MEDICATIONS & ALLERGIES:    Patient reports the following regarding taking any anticoagulation/antiplatelet therapy:   Plavix, Coumadin, Eliquis, Xarelto, Lovenox, Pradaxa, Brilinta, or Effient? no Aspirin? no  Patient confirms/reports the following medications:  Current Outpatient Medications  Medication Sig Dispense Refill   amLODipine (NORVASC) 5 MG tablet Take 1 tablet (5 mg total) by mouth daily. 90 tablet 1   Ascorbic Acid (VITAMIN C) 500 MG CAPS Take 500 mg by mouth daily. (Patient taking differently: Take 1,000 mg by mouth daily.)     Calcium Carb-Cholecalciferol 600-800 MG-UNIT TABS Take by mouth.     Garlic 5284 MG CAPS Take by mouth 2 (two) times daily.      glucosamine-chondroitin 500-400 MG tablet Take 1 tablet by mouth 2 (two) times daily. (Patient taking differently: Take 1 tablet by mouth 2 (two) times daily.)     hydrochlorothiazide (HYDRODIURIL) 25 MG tablet Take 1 tablet (25 mg total) by mouth daily. 90 tablet 1   losartan (COZAAR) 100 MG tablet Take 1 tablet (100 mg total) by mouth daily. 90 tablet 1   Multiple Vitamins-Minerals (MULTIVITAMIN WITH MINERALS) tablet Take 1 tablet by mouth daily.     pyridOXINE (VITAMIN B-6) 100 MG tablet Take 100 mg by mouth daily.     rosuvastatin (CRESTOR) 10 MG tablet Take 1 tablet (10 mg total) by  mouth at bedtime. 90 tablet 3   sertraline (ZOLOFT) 50 MG tablet Take 1 tablet (50 mg total) by mouth daily. 90 tablet 3   No current facility-administered medications for this visit.    Patient confirms/reports the following allergies:  Allergies  Allergen Reactions   Penicillins Hives    No orders of the defined types were placed in this encounter.   AUTHORIZATION INFORMATION Primary Insurance: 1D#: Group #:  Secondary Insurance: 1D#: Group #:  SCHEDULE INFORMATION: Date: 04/06/2021 Time: Location: [place]

## 2021-03-21 NOTE — Telephone Encounter (Signed)
Procedure has been scheduled for 04/06/21.

## 2021-03-21 NOTE — Telephone Encounter (Signed)
Inbound call from pt's sister Cyndra Numbers wanting to schedule pt's colonoscopy.

## 2021-03-22 ENCOUNTER — Encounter: Payer: Self-pay | Admitting: Gastroenterology

## 2021-04-06 ENCOUNTER — Ambulatory Visit
Admission: RE | Admit: 2021-04-06 | Discharge: 2021-04-06 | Disposition: A | Discharge status: Home / Self Care | Payer: PPO | Source: Ambulatory Visit | Attending: Gastroenterology | Admitting: Gastroenterology

## 2021-04-06 ENCOUNTER — Encounter
Admission: RE | Disposition: A | Discharge status: Home / Self Care | Payer: Self-pay | Source: Ambulatory Visit | Attending: Gastroenterology

## 2021-04-06 ENCOUNTER — Other Ambulatory Visit: Payer: Self-pay

## 2021-04-06 ENCOUNTER — Ambulatory Visit: Payer: PPO | Admitting: Anesthesiology

## 2021-04-06 ENCOUNTER — Encounter: Payer: Self-pay | Admitting: Gastroenterology

## 2021-04-06 DIAGNOSIS — Z860100 Personal history of colon polyps, unspecified: Secondary | ICD-10-CM

## 2021-04-06 DIAGNOSIS — K640 First degree hemorrhoids: Secondary | ICD-10-CM | POA: Diagnosis not present

## 2021-04-06 DIAGNOSIS — D123 Benign neoplasm of transverse colon: Secondary | ICD-10-CM | POA: Diagnosis not present

## 2021-04-06 DIAGNOSIS — K5730 Diverticulosis of large intestine without perforation or abscess without bleeding: Secondary | ICD-10-CM | POA: Insufficient documentation

## 2021-04-06 DIAGNOSIS — I10 Essential (primary) hypertension: Secondary | ICD-10-CM | POA: Insufficient documentation

## 2021-04-06 DIAGNOSIS — F419 Anxiety disorder, unspecified: Secondary | ICD-10-CM | POA: Diagnosis not present

## 2021-04-06 DIAGNOSIS — Z1211 Encounter for screening for malignant neoplasm of colon: Secondary | ICD-10-CM | POA: Diagnosis not present

## 2021-04-06 DIAGNOSIS — K621 Rectal polyp: Secondary | ICD-10-CM

## 2021-04-06 DIAGNOSIS — K635 Polyp of colon: Secondary | ICD-10-CM | POA: Diagnosis not present

## 2021-04-06 DIAGNOSIS — D128 Benign neoplasm of rectum: Secondary | ICD-10-CM | POA: Insufficient documentation

## 2021-04-06 HISTORY — DX: Prediabetes: R73.03

## 2021-04-06 HISTORY — PX: COLONOSCOPY WITH PROPOFOL: SHX5780

## 2021-04-06 HISTORY — PX: POLYPECTOMY: SHX5525

## 2021-04-06 SURGERY — COLONOSCOPY WITH PROPOFOL
Anesthesia: General | Site: Rectum

## 2021-04-06 MED ORDER — SODIUM CHLORIDE 0.9 % IV SOLN: INTRAVENOUS | Status: DC

## 2021-04-06 MED ORDER — ACETAMINOPHEN 160 MG/5ML PO SOLN: 325.0000 mg | Freq: Once | ORAL | Status: DC

## 2021-04-06 MED ORDER — PROPOFOL 10 MG/ML IV BOLUS
INTRAVENOUS | Status: DC | PRN
Start: 2021-04-06 — End: 2021-04-06
  Administered 2021-04-06 (×2): 30 mg via INTRAVENOUS
  Administered 2021-04-06: 120 mg via INTRAVENOUS
  Administered 2021-04-06 (×2): 30 mg via INTRAVENOUS

## 2021-04-06 MED ORDER — STERILE WATER FOR IRRIGATION IR SOLN
Status: DC | PRN
  Administered 2021-04-06: 250 mL

## 2021-04-06 MED ORDER — LIDOCAINE HCL (CARDIAC) PF 100 MG/5ML IV SOSY
PREFILLED_SYRINGE | INTRAVENOUS | Status: DC | PRN
  Administered 2021-04-06: 50 mg via INTRAVENOUS

## 2021-04-06 MED ORDER — ACETAMINOPHEN 325 MG PO TABS: 325.0000 mg | ORAL_TABLET | Freq: Once | ORAL | Status: DC

## 2021-04-06 MED ORDER — LACTATED RINGERS IV SOLN: INTRAVENOUS | Status: DC

## 2021-04-06 SURGICAL SUPPLY — 8 items
GOWN CVR UNV OPN BCK APRN NK (MISCELLANEOUS) ×4 IMPLANT
GOWN ISOL THUMB LOOP REG UNIV (MISCELLANEOUS) ×6
KIT PRC NS LF DISP ENDO (KITS) ×2 IMPLANT
KIT PROCEDURE OLYMPUS (KITS) ×3
MANIFOLD NEPTUNE II (INSTRUMENTS) ×3 IMPLANT
SNARE COLD EXACTO (MISCELLANEOUS) ×1 IMPLANT
TRAP ETRAP POLY (MISCELLANEOUS) ×1 IMPLANT
WATER STERILE IRR 250ML POUR (IV SOLUTION) ×3 IMPLANT

## 2021-04-06 NOTE — Op Note (Signed)
[facility] Gastroenterology Patient Name: Heidi Bell Procedure Date: 04/06/2021 9:09 AM MRN: [id number] Account #: [id number] Date of Birth: [birth year] Admit Type: Outpatient Age: 71 Room: [facility] OR ROOM 01 Gender: Female Note Status: Finalized Instrument Name: [id number] Procedure:             Colonoscopy Indications:           High risk colon cancer surveillance: Personal history                         of colonic polyps Providers:             Lucilla Lame MD, MD Referring MD:          Olin Hauser (Referring MD) Medicines:             Propofol per Anesthesia Complications:         No immediate complications. Procedure:             Pre-Anesthesia Assessment:                        - Prior to the procedure, a History and Physical was                         performed, and patient medications and allergies were                         reviewed. The patient's tolerance of previous                         anesthesia was also reviewed. The risks and benefits                         of the procedure and the sedation options and risks                         were discussed with the patient. All questions were                         answered, and informed consent was obtained. Prior                         Anticoagulants: The patient has taken no previous                         anticoagulant or antiplatelet agents. ASA Grade                         Assessment: II - A patient with mild systemic disease.                         After reviewing the risks and benefits, the patient                         was deemed in satisfactory condition to undergo the                         procedure.  After obtaining informed consent, the colonoscope was                         passed under direct vision. Throughout the procedure,                         the patient's blood pressure, pulse, and oxygen                         saturations were monitored  continuously. The                         Colonoscope was introduced through the anus and                         advanced to the the cecum, identified by appendiceal                         orifice and ileocecal valve. The colonoscopy was                         performed without difficulty. The patient tolerated                         the procedure well. The quality of the bowel                         preparation was excellent. Findings:      The perianal and digital rectal examinations were normal.      A 3 mm polyp was found in the rectum. The polyp was sessile. The polyp       was removed with a cold snare. Resection and retrieval were complete.      A 3 mm polyp was found in the transverse colon. The polyp was sessile.       The polyp was removed with a cold snare. Resection and retrieval were       complete.      Multiple small-mouthed diverticula were found in the sigmoid colon.      Non-bleeding internal hemorrhoids were found during retroflexion. The       hemorrhoids were Grade I (internal hemorrhoids that do not prolapse). Impression:            - One 3 mm polyp in the rectum, removed with a cold                         snare. Resected and retrieved.                        - One 3 mm polyp in the transverse colon, removed with                         a cold snare. Resected and retrieved.                        - Diverticulosis in the sigmoid colon.                        - Non-bleeding internal hemorrhoids. Recommendation:        -  Discharge patient to home.                        - Resume previous diet.                        - Await pathology results.                        - Repeat colonoscopy in 7 years for surveillance. Procedure Code(s):     --- Professional ---                        [phone], Colonoscopy, flexible; with removal of                         tumor(s), polyp(s), or other lesion(s) by snare                         technique Diagnosis Code(s):     ---  Professional ---                        Z86.010, Personal history of colonic polyps                        K62.1, Rectal polyp                        K63.5, Polyp of colon CPT copyright 2019 American Medical Association. All rights reserved. The codes documented in this report are preliminary and upon coder review may  be revised to meet current compliance requirements. Lucilla Lame MD, MD 04/06/2021 9:38:57 AM This report has been signed electronically. Number of Addenda: 0 Note Initiated On: 04/06/2021 9:09 AM Scope Withdrawal Time: 0 hours 9 minutes 58 seconds  Total Procedure Duration: 0 hours 12 minutes 43 seconds  Estimated Blood Loss:  Estimated blood loss: none.      [facility]

## 2021-04-06 NOTE — Anesthesia Preprocedure Evaluation (Signed)
Anesthesia Evaluation  Patient identified by MRN, date of birth, ID band Patient awake    Reviewed: Allergy & Precautions, H&P , NPO status , Patient's Chart, lab work & pertinent test results  Airway Mallampati: II  TM Distance: >3 FB Neck ROM: full    Dental no notable dental hx.    Pulmonary    Pulmonary exam normal breath sounds clear to auscultation       Cardiovascular hypertension, Normal cardiovascular exam Rhythm:regular Rate:Normal     Neuro/Psych Anxiety    GI/Hepatic   Endo/Other    Renal/GU      Musculoskeletal   Abdominal   Peds  Hematology   Anesthesia Other Findings   Reproductive/Obstetrics                             Anesthesia Physical Anesthesia Plan  ASA: 2  Anesthesia Plan: General   Post-op Pain Management: Minimal or no pain anticipated   Induction: Intravenous  PONV Risk Score and Plan: 3 and Treatment may vary due to age or medical condition, Propofol infusion and TIVA  Airway Management Planned: Natural Airway  Additional Equipment:   Intra-op Plan:   Post-operative Plan:   Informed Consent: I have reviewed the patients History and Physical, chart, labs and discussed the procedure including the risks, benefits and alternatives for the proposed anesthesia with the patient or authorized representative who has indicated his/her understanding and acceptance.     Dental Advisory Given  Plan Discussed with: CRNA  Anesthesia Plan Comments:         Anesthesia Quick Evaluation

## 2021-04-06 NOTE — Anesthesia Postprocedure Evaluation (Signed)
Anesthesia Post Note  Patient: Heidi Bell  Procedure(s) Performed: COLONOSCOPY WITH PROPOFOL POLYPECTOMY (Rectum)     Patient location during evaluation: PACU Anesthesia Type: General Level of consciousness: awake and alert and oriented Pain management: satisfactory to patient Vital Signs Assessment: post-procedure vital signs reviewed and stable Respiratory status: spontaneous breathing, nonlabored ventilation and respiratory function stable Cardiovascular status: blood pressure returned to baseline and stable Postop Assessment: Adequate PO intake and No signs of nausea or vomiting Anesthetic complications: no   No notable events documented.  Raliegh Ip

## 2021-04-06 NOTE — Transfer of Care (Signed)
Immediate Anesthesia Transfer of Care Note  Patient: Heidi Bell  Procedure(s) Performed: COLONOSCOPY WITH PROPOFOL POLYPECTOMY (Rectum)  Patient Location: PACU  Anesthesia Type: General  Level of Consciousness: awake, alert  and patient cooperative  Airway and Oxygen Therapy: Patient Spontanous Breathing and Patient connected to supplemental oxygen  Post-op Assessment: Post-op Vital signs reviewed, Patient's Cardiovascular Status Stable, Respiratory Function Stable, Patent Airway and No signs of Nausea or vomiting  Post-op Vital Signs: Reviewed and stable  Complications: No notable events documented.

## 2021-04-06 NOTE — H&P (Signed)
Lucilla Lame, MD [facility] [address], [place], [place] [address] Phone:[phone] Fax : [phone]  Primary Care Physician:  Olin Hauser, DO Primary Gastroenterologist:  Dr. Allen Norris  Pre-Procedure History & Physical: HPI:  Heidi Bell is a 71 y.o. female is here for an colonoscopy.   Past Medical History:  Diagnosis Date   Anxiety    Atypical mole 06/16/2018   L mid lat back near braline/mod   Basal cell carcinoma 04/22/2018   L cheek/excision   Hyperlipidemia    Hypertension    Pre-diabetes     Past Surgical History:  Procedure Laterality Date   ABDOMINAL HYSTERECTOMY     boil removed  1984   BREAST CYST ASPIRATION Left 1994   Dr. Bary Castilla   BREAST CYST ASPIRATION Left 10/2017   COLONOSCOPY WITH PROPOFOL N/A 01/12/2015   Procedure: COLONOSCOPY WITH PROPOFOL;  Surgeon: Christene Lye, MD;  Location: [facility] ENDOSCOPY;  Service: Endoscopy;  Laterality: N/A;   SKIN CANCER EXCISION     left shoulder, mid back and left cheek.     Prior to Admission medications   Medication Sig Start Date End Date Taking? Authorizing Provider  amLODipine (NORVASC) 5 MG tablet Take 1 tablet (5 mg total) by mouth daily. 08/31/20  Yes Karamalegos, Devonne Doughty, DO  Ascorbic Acid (VITAMIN C) 500 MG CAPS Take 500 mg by mouth daily. Patient taking differently: Take 1,000 mg by mouth daily. 02/26/17  Yes Mikey College, NP  Calcium Carb-Cholecalciferol 600-800 MG-UNIT TABS Take by mouth.   Yes [provider]  Garlic 8413 MG CAPS Take by mouth 2 (two) times daily.    Yes [provider]  glucosamine-chondroitin 500-400 MG tablet Take 1 tablet by mouth 2 (two) times daily. Patient taking differently: Take 1 tablet by mouth 2 (two) times daily. 02/26/17  Yes Mikey College, NP  hydrochlorothiazide (HYDRODIURIL) 25 MG tablet Take 1 tablet (25 mg total) by mouth daily. 08/31/20  Yes Karamalegos, Devonne Doughty, DO  losartan (COZAAR) 100 MG tablet  Take 1 tablet (100 mg total) by mouth daily. 08/31/20  Yes Karamalegos, Devonne Doughty, DO  Multiple Vitamins-Minerals (MULTIVITAMIN WITH MINERALS) tablet Take 1 tablet by mouth daily.   Yes [provider]  pyridOXINE (VITAMIN B-6) 100 MG tablet Take 100 mg by mouth daily.   Yes [provider]  rosuvastatin (CRESTOR) 10 MG tablet Take 1 tablet (10 mg total) by mouth at bedtime. 04/20/20  Yes Karamalegos, Devonne Doughty, DO  sertraline (ZOLOFT) 50 MG tablet Take 1 tablet (50 mg total) by mouth daily. 04/20/20  Yes Karamalegos, Devonne Doughty, DO    Allergies as of 03/21/2021 - Review Complete 03/14/2021  Allergen Reaction Noted   Penicillins Hives 12/20/2014    Family History  Problem Relation Age of Onset   Colon cancer Father    COPD Mother    Lung cancer Mother    Bleeding Disorder Mother    Breast cancer Sister     Social History   Socioeconomic History   Marital status: Single    Spouse name: Not on file   Number of children: Not on file   Years of education: Not on file   Highest education level: High school graduate  Occupational History   Occupation: retired  Tobacco Use   Smoking status: Never   Smokeless tobacco: Never  Vaping Use   Vaping Use: Never used  Substance and Sexual Activity   Alcohol use: No    Alcohol/week: 0.0 standard  drinks   Drug use: No   Sexual activity: Not on file  Other Topics Concern   Not on file  Social History Narrative   Not on file   Social Determinants of Health   Financial Resource Strain: Not on file  Food Insecurity: Not on file  Transportation Needs: Not on file  Physical Activity: Not on file  Stress: Not on file  Social Connections: Not on file  Intimate Partner Violence: Not on file    Review of Systems: See HPI, otherwise negative ROS  Physical Exam: BP (!) 191/75    Pulse 100    Temp (!) 97.1 F (36.2 C) (Temporal)    Resp 16    Ht 5' 2.5" (1.588 m)    Wt 61.2 kg    SpO2 100%    BMI 24.30 kg/m   General:   Alert,  pleasant and cooperative in NAD Head:  Normocephalic and atraumatic. Neck:  Supple; no masses or thyromegaly. Lungs:  Clear throughout to auscultation.    Heart:  Regular rate and rhythm. Abdomen:  Soft, nontender and nondistended. Normal bowel sounds, without guarding, and without rebound.   Neurologic:  Alert and  oriented x4;  grossly normal neurologically.  Impression/Plan: Heidi Bell is here for an colonoscopy to be performed fo a history of adenomatous polyps on 2016   Risks, benefits, limitations, and alternatives regarding  colonoscopy have been reviewed with the patient.  Questions have been answered.  All parties agreeable.   Lucilla Lame, MD  04/06/2021, 8:45 AM

## 2021-04-06 NOTE — Anesthesia Procedure Notes (Signed)
Date/Time: 04/06/2021 9:19 AM Performed by: Cameron Ali, CRNA Pre-anesthesia Checklist: Patient identified, Emergency Drugs available, Suction available, Timeout performed and Patient being monitored Patient Re-evaluated:Patient Re-evaluated prior to induction Oxygen Delivery Method: Nasal cannula Placement Confirmation: positive ETCO2

## 2021-04-07 ENCOUNTER — Encounter: Payer: Self-pay | Admitting: Gastroenterology

## 2021-04-07 LAB — SURGICAL PATHOLOGY

## 2021-04-24 ENCOUNTER — Other Ambulatory Visit: Payer: Self-pay | Admitting: Family Medicine

## 2021-04-24 DIAGNOSIS — F419 Anxiety disorder, unspecified: Secondary | ICD-10-CM

## 2021-04-24 DIAGNOSIS — I10 Essential (primary) hypertension: Secondary | ICD-10-CM

## 2021-04-24 DIAGNOSIS — E782 Mixed hyperlipidemia: Secondary | ICD-10-CM

## 2021-04-25 NOTE — Telephone Encounter (Signed)
Requested Prescriptions  Pending Prescriptions Disp Refills   amLODipine (NORVASC) 5 MG tablet [Pharmacy Med Name: amLODIPine BESYLATE 5 MG TAB] 90 tablet 0    Sig: Take 1 tablet by mouth once daily     Cardiovascular:  Calcium Channel Blockers Passed - 04/24/2021  6:09 PM      Passed - Last BP in normal range    BP Readings from Last 1 Encounters:  04/06/21 131/62         Passed - Valid encounter within last 6 months    Recent Outpatient Visits          1 month ago Impacted cerumen of both ears   [place], DO   1 month ago Annual physical exam   [place], DO   7 months ago Essential hypertension   [place], DO   1 year ago Elevated hemoglobin A1c   [place], DO   1 year ago Elevated hemoglobin A1c   [place], DO      Future Appointments            In 4 months [place], Devonne Doughty, DO [facility], [place]   In 6 months Ralene Bathe, MD [place]            hydrochlorothiazide (HYDRODIURIL) 25 MG tablet [Pharmacy Med Name: HYDROCHLOROTHIAZIDE 25 MG TAB] 90 tablet 0    Sig: Take 1 tablet by mouth once daily     Cardiovascular: Diuretics - Thiazide Failed - 04/24/2021  6:09 PM      Failed - Cr in normal range and within 360 days    Creat  Date Value Ref Range Status  02/27/2021 0.59 (L) 0.60 - 1.00 mg/dL Final         Passed - Ca in normal range and within 360 days    Calcium  Date Value Ref Range Status  02/27/2021 9.5 8.6 - 10.4 mg/dL Final         Passed - K in normal range and within 360 days    Potassium  Date Value Ref Range Status  02/27/2021 4.0 3.5 - 5.3 mmol/L Final         Passed - Na in normal range and within 360 days    Sodium  Date Value Ref Range Status  02/27/2021 142 135 - 146 mmol/L Final          Passed - Last BP in normal range    BP Readings from Last 1 Encounters:  04/06/21 131/62         Passed - Valid encounter within last 6 months    Recent Outpatient Visits          1 month ago Impacted cerumen of both ears   [place], DO   1 month ago Annual physical exam   [place], DO   7 months ago Essential hypertension   [place], DO   1 year ago Elevated hemoglobin A1c   [place], DO   1 year ago Elevated hemoglobin A1c   [facility] [place], Devonne Doughty, DO      Future Appointments            In 4 months [place],  [facility], [place]   In 6 months Ralene Bathe, MD [place]            losartan (COZAAR) 100 MG tablet [Pharmacy Med Name: LOSARTAN POTASSIUM 100 MG TAB] 90 tablet 0    Sig: Take 1 tablet by mouth once daily     Cardiovascular:  Angiotensin Receptor Blockers Failed - 04/24/2021  6:09 PM      Failed - Cr in normal range and within 180 days    Creat  Date Value Ref Range Status  02/27/2021 0.59 (L) 0.60 - 1.00 mg/dL Final         Passed - K in normal range and within 180 days    Potassium  Date Value Ref Range Status  02/27/2021 4.0 3.5 - 5.3 mmol/L Final         Passed - Patient is not pregnant      Passed - Last BP in normal range    BP Readings from Last 1 Encounters:  04/06/21 131/62         Passed - Valid encounter within last 6 months    Recent Outpatient Visits          1 month ago Impacted cerumen of both ears   [place], DO   1 month ago Annual physical exam   [facility] Olin Hauser, DO   7 months ago Essential hypertension   [place], DO   1 year ago Elevated  hemoglobin A1c   [place], DO   1 year ago Elevated hemoglobin A1c   [place], DO      Future Appointments            In 4 months [place], Devonne Doughty, DO [facility], [place]   In 6 months Ralene Bathe, MD [place]            rosuvastatin ([place]) 10 MG tablet [Pharmacy Med Name: ROSUVASTATIN CALCIUM 10 MG TAB] 90 tablet 2    Sig: Take 1 tablet by mouth at bedtime     Cardiovascular:  Antilipid - Statins Passed - 04/24/2021  6:09 PM      Passed - Total Cholesterol in normal range and within 360 days    Cholesterol  Date Value Ref Range Status  02/27/2021 143 <200 mg/dL Final         Passed - LDL in normal range and within 360 days    LDL Cholesterol (Calc)  Date Value Ref Range Status  02/27/2021 54 mg/dL (calc) Final    Comment:    Reference range: <100 . Desirable range <100 mg/dL for primary prevention;   <70 mg/dL for patients with CHD or diabetic patients  with > or = 2 CHD risk factors. Marland Kitchen LDL-C is now calculated using the Martin-Hopkins  calculation, which is a validated novel method providing  better accuracy than the Friedewald equation in the  estimation of LDL-C.  Cresenciano Genre et al. Annamaria Helling. 6160;737(10): 2061-2068  (http://education.[employer].com/faq/FAQ164)          Passed - HDL in normal range and within 360 days    HDL  Date Value Ref Range Status  02/27/2021 75 > OR = 50 mg/dL Final         Passed - Triglycerides in normal range and within 360 days  Triglycerides  Date Value Ref Range Status  02/27/2021 59 <150 mg/dL Final         Passed - Patient is not pregnant      Passed - Valid encounter within last 12 months    Recent Outpatient Visits          1 month ago Impacted cerumen of both ears   [place], DO   1 month ago Annual physical exam   [facility] Olin Hauser, DO   7 months ago Essential hypertension   [place], DO   1 year ago Elevated hemoglobin A1c   [place], DO   1 year ago Elevated hemoglobin A1c   [place], DO      Future Appointments            In 4 months [place], Devonne Doughty, DO [facility], [place]   In 6 months Ralene Bathe, MD [place]            sertraline (ZOLOFT) 50 MG tablet [Pharmacy Med Name: SERTRALINE HCL 50 MG TABLET] 90 tablet 2    Sig: Take 1 tablet by mouth daily     Psychiatry:  Antidepressants - SSRI Passed - 04/24/2021  6:09 PM      Passed - Valid encounter within last 6 months    Recent Outpatient Visits          1 month ago Impacted cerumen of both ears   [place], DO   1 month ago Annual physical exam   [facility] Olin Hauser, DO   7 months ago Essential hypertension   [place], DO   1 year ago Elevated hemoglobin A1c   [place], DO   1 year ago Elevated hemoglobin A1c   [place], DO      Future Appointments            In 4 months [place], Devonne Doughty, DO [place], [place]   In 6 months Ralene Bathe, MD [place]

## 2021-05-09 ENCOUNTER — Other Ambulatory Visit: Payer: Self-pay

## 2021-05-09 ENCOUNTER — Ambulatory Visit
Admission: RE | Admit: 2021-05-09 | Discharge: 2021-05-09 | Disposition: A | Discharge status: Home / Self Care | Payer: PPO | Source: Ambulatory Visit | Attending: Family Medicine | Admitting: Family Medicine

## 2021-05-09 DIAGNOSIS — Z1231 Encounter for screening mammogram for malignant neoplasm of breast: Secondary | ICD-10-CM | POA: Insufficient documentation

## 2021-05-26 ENCOUNTER — Telehealth: Payer: Self-pay | Admitting: Family Medicine

## 2021-05-26 NOTE — Telephone Encounter (Signed)
Left message for patient to call back and schedule the Medicare Annual Wellness Visit (AWV) virtually or by telephone.  Last AWV 09/02/18  Please schedule at anytime with [place].  40 minute appointment  Any questions, please call me at [phone]

## 2021-06-02 DIAGNOSIS — H02882 Meibomian gland dysfunction right lower eyelid: Secondary | ICD-10-CM | POA: Diagnosis not present

## 2021-06-02 DIAGNOSIS — I10 Essential (primary) hypertension: Secondary | ICD-10-CM | POA: Diagnosis not present

## 2021-06-02 DIAGNOSIS — H02884 Meibomian gland dysfunction left upper eyelid: Secondary | ICD-10-CM | POA: Diagnosis not present

## 2021-06-02 DIAGNOSIS — H43811 Vitreous degeneration, right eye: Secondary | ICD-10-CM | POA: Diagnosis not present

## 2021-06-02 DIAGNOSIS — H2513 Age-related nuclear cataract, bilateral: Secondary | ICD-10-CM | POA: Diagnosis not present

## 2021-07-11 ENCOUNTER — Telehealth: Payer: Self-pay | Admitting: Family Medicine

## 2021-07-11 NOTE — Telephone Encounter (Signed)
Spoke with [name] and she is going to call back to schedule the [employer] Visit (AWV).  She wants to check on transportation for the patient before scheduling appointment. ? ?Last AWV 09/02/18 ? ?Please schedule at anytime with [facility]. ? ? ?Any questions, please call me at [phone]  ?

## 2021-07-27 ENCOUNTER — Ambulatory Visit (INDEPENDENT_AMBULATORY_CARE_PROVIDER_SITE_OTHER): Payer: PPO

## 2021-07-27 VITALS — BP 139/53 | HR 77 | Temp 98.7°F | Resp 17 | Ht 60.0 in | Wt 139.4 lb

## 2021-07-27 DIAGNOSIS — Z0000 Encounter for general adult medical examination without abnormal findings: Secondary | ICD-10-CM | POA: Diagnosis not present

## 2021-07-27 NOTE — Progress Notes (Signed)
? ?Subjective:  ? Heidi Bell is a 71 y.o. female who presents for Medicare Annual (Subsequent) preventive examination. ? ?Review of Systems    ?Per HPI unless specifically indicated below ?  ? ?   ?Objective:  ?  ?Today's Vitals  ? 07/27/21 1435  ?BP: (!) 139/53  ?Pulse: 77  ?Resp: 17  ?Temp: 98.7 ?F (37.1 ?C)  ?TempSrc: Temporal  ?SpO2: 100%  ?Weight: 139 lb 6.4 oz (63.2 kg)  ?Height: 5' (1.524 m)  ?PainSc: 5   ?PainLoc: Hand  ? ?Body mass index is 27.22 kg/m?. ? ? ?  04/06/2021  ?  8:14 AM 09/02/2018  ? 11:23 AM 07/30/2017  ?  2:25 PM 01/12/2015  ?  7:43 AM  ?Advanced Directives  ?Does Patient Have a Medical Advance Directive? Yes Yes Yes No  ?Type of Advance Directive Living will Living will;Healthcare Power of Attorney Living will;Healthcare Power of Attorney   ?Does patient want to make changes to medical advance directive? No - Guardian declined     ?Copy of [place] in Chart?  Yes - validated most recent copy scanned in chart (See row information) No - copy requested   ?Would patient like information on creating a medical advance directive?    No - patient declined information  ? ? ?Current Medications (verified) ?Outpatient Encounter Medications as of 07/27/2021  ?Medication Sig  ? amLODipine (NORVASC) 5 MG tablet Take 1 tablet by mouth once daily  ? Ascorbic Acid (VITAMIN C) 500 MG CAPS Take 500 mg by mouth daily. (Patient taking differently: Take 1,000 mg by mouth daily.)  ? Calcium Carb-Cholecalciferol 600-800 MG-UNIT TABS Take by mouth.  ? Garlic 3559 MG CAPS Take by mouth 2 (two) times daily.   ? glucosamine-chondroitin 500-400 MG tablet Take 1 tablet by mouth 2 (two) times daily. (Patient taking differently: Take 1 tablet by mouth 2 (two) times daily.)  ? hydrochlorothiazide (HYDRODIURIL) 25 MG tablet Take 1 tablet by mouth once daily  ? losartan (COZAAR) 100 MG tablet Take 1 tablet by mouth once daily  ? Multiple Vitamins-Minerals (MULTIVITAMIN WITH MINERALS) tablet Take 1 tablet  by mouth daily.  ? pyridOXINE (VITAMIN B-6) 100 MG tablet Take 100 mg by mouth daily.  ? rosuvastatin (CRESTOR) 10 MG tablet Take 1 tablet by mouth at bedtime  ? sertraline (ZOLOFT) 50 MG tablet Take 1 tablet by mouth daily  ? ?No facility-administered encounter medications on file as of 07/27/2021.  ? ? ?Allergies (verified) ?Penicillins  ? ?History: ?Past Medical History:  ?Diagnosis Date  ? Anxiety   ? Atypical mole 06/16/2018  ? L mid lat back near braline/mod  ? Basal cell carcinoma 04/22/2018  ? L cheek/excision  ? Hyperlipidemia   ? Hypertension   ? Pre-diabetes   ? ?Past Surgical History:  ?Procedure Laterality Date  ? ABDOMINAL HYSTERECTOMY    ? boil removed  1984  ? BREAST CYST ASPIRATION Left 1994  ? Dr. Bary Castilla  ? BREAST CYST ASPIRATION Left 10/2017  ? COLONOSCOPY WITH PROPOFOL N/A 01/12/2015  ? Procedure: COLONOSCOPY WITH PROPOFOL;  Surgeon: Christene Lye, MD;  Location: [facility];  Service: Endoscopy;  Laterality: N/A;  ? COLONOSCOPY WITH PROPOFOL N/A 04/06/2021  ? Procedure: COLONOSCOPY WITH PROPOFOL;  Surgeon: Lucilla Lame, MD;  Location: [place];  Service: Endoscopy;  Laterality: N/A;  ? POLYPECTOMY  04/06/2021  ? Procedure: POLYPECTOMY;  Surgeon: Lucilla Lame, MD;  Location: [place];  Service: Endoscopy;;  ? SKIN CANCER EXCISION    ?  left shoulder, mid back and left cheek.   ? ?Family History  ?Problem Relation Age of Onset  ? Colon cancer Father   ? COPD Mother   ? Lung cancer Mother   ? Bleeding Disorder Mother   ? Breast cancer Sister   ? ?Social History  ? ?Socioeconomic History  ? Marital status: Single  ?  Spouse name: Not on file  ? Number of children: 0  ? Years of education: Not on file  ? Highest education level: High school graduate  ?Occupational History  ? Occupation: retired  ?Tobacco Use  ? Smoking status: Never  ? Smokeless tobacco: Never  ?Vaping Use  ? Vaping Use: Never used  ?Substance and Sexual Activity  ? Alcohol use: No  ?  Alcohol/week: 0.0  standard drinks  ? Drug use: No  ? Sexual activity: Not on file  ?Other Topics Concern  ? Not on file  ?Social History Narrative  ? Not on file  ? ?Social Determinants of Health  ? ?Financial Resource Strain: Low Risk   ? Difficulty of Paying Living Expenses: Not hard at all  ?Food Insecurity: No Food Insecurity  ? Worried About Charity fundraiser in the Last Year: Never true  ? Ran Out of Food in the Last Year: Never true  ?Transportation Needs: No Transportation Needs  ? Lack of Transportation (Medical): No  ? Lack of Transportation (Non-Medical): No  ?Physical Activity: Insufficiently Active  ? Days of Exercise per Week: 3 days  ? Minutes of Exercise per Session: 10 min  ?Stress: No Stress Concern Present  ? Feeling of Stress : Only a little  ?Social Connections: Unknown  ? Frequency of Communication with Friends and Family: Twice a week  ? Frequency of Social Gatherings with Friends and Family: More than three times a week  ? Attends Religious Services: Never  ? Active Member of Clubs or Organizations: No  ? Attends Archivist Meetings: Never  ? Marital Status: Patient refused  ? ? ?Tobacco Counseling ?Counseling given: Not Answered ? ? ?Clinical Intake: ? ?Pre-visit preparation completed: No ? ?Pain : 0-10 ?Pain Score: 5  ?Pain Type: Chronic pain ?Pain Location: Hand ?Pain Orientation: Right, Left ?Pain Descriptors / Indicators: Pins and needles, Tingling, Numbness ?Pain Onset: More than a month ago ?Pain Frequency: Intermittent ? ?  ? ?Nutritional Status: BMI of 19-24  Normal ?Nutritional Risks: None ?Diabetes: No ? ?How often do you need to have someone help you when you read instructions, pamphlets, or other written materials from your doctor or pharmacy?: 1 - Never ? ?Diabetic? No   ? ?Interpreter Needed?: No ? ?Information entered by :: Donnie Mesa, CMA ? ? ?Activities of Daily Living ? ?  07/27/2021  ?  2:46 PM 04/06/2021  ?  8:18 AM  ?In your present state of health, do you have any difficulty  performing the following activities:  ?Hearing? 0 0  ?Vision? 1 0  ?Difficulty concentrating or making decisions? 1 0  ?Walking or climbing stairs? 1 0  ?Dressing or bathing? 0 0  ?Doing errands, shopping? 0   ? ? ?Patient Care Team: ?Olin Hauser, DO as PCP - General (Family Medicine) ?Robert Bellow, MD as Consulting Physician (General Surgery) ?Albina Billet, MD as Referring Physician (Internal Medicine) ? ?Indicate any recent Medical Services you may have received from other than [facility] providers in the past year (date may be approximate). ? ?   ?Assessment:  ? This is a  routine wellness examination for Kassi. ? ?Hearing/Vision screen ?No results found. ? ?Dietary issues and exercise activities discussed: ?  ? ? Goals Addressed   ?None ?  ? ?Depression Screen ? ?  07/27/2021  ?  2:41 PM 03/06/2021  ?  8:35 AM 02/29/2020  ?  8:16 AM 08/28/2019  ?  8:20 AM 06/18/2019  ?  4:14 PM 03/02/2019  ?  8:12 AM 09/16/2018  ? 10:10 AM  ?PHQ 2/9 Scores  ?PHQ - 2 Score 0 0 0 0 0 0 0  ?PHQ- 9 Score  0    4   ?  ?Fall Risk ? ?  07/27/2021  ?  2:45 PM 03/06/2021  ?  8:35 AM 02/29/2020  ?  8:16 AM 08/28/2019  ?  8:20 AM 09/16/2018  ? 10:09 AM  ?Fall Risk   ?Falls in the past year? 1 0 0 0 0  ?Number falls in past yr: 0 0 0 0   ?Injury with Fall? 0 0 0 0   ?Risk for fall due to : No Fall Risks      ?Follow up Falls evaluation completed Falls evaluation completed Falls evaluation completed Falls evaluation completed Falls evaluation completed  ? ? ?FALL RISK PREVENTION PERTAINING TO THE HOME: ? ?Any stairs in or around the home? No  ?If so, are there any without handrails? Yes  ?Home free of loose throw rugs in walkways, pet beds, electrical cords, etc? Yes  ?Adequate lighting in your home to reduce risk of falls? Yes  ? ?ASSISTIVE DEVICES UTILIZED TO PREVENT FALLS: ? ?Life alert? No  ?Use of a cane, walker or w/c? No  ?Grab bars in the bathroom? No  ?Shower chair or bench in shower? No  ?Elevated toilet seat or a handicapped  toilet? No  ? ?TIMED UP AND GO: ? ?Was the test performed? Yes .  ?Length of time to ambulate 10 feet: 10  sec.  ? ?Gait steady and fast without use of assistive device ? ?Cognitive Function: ? ?  6/2/

## 2021-08-09 ENCOUNTER — Telehealth: Payer: Self-pay

## 2021-08-09 NOTE — Telephone Encounter (Signed)
Called and spoke with the patient's caregiver. She states that the patient is not a patient here and that she sees Dr. Raliegh Ip at [facility].  ? ?Reattribution spreadsheet updated with information.  ?

## 2021-09-05 ENCOUNTER — Encounter: Payer: Self-pay | Admitting: Family Medicine

## 2021-09-05 ENCOUNTER — Ambulatory Visit
Admission: RE | Admit: 2021-09-05 | Discharge: 2021-09-05 | Disposition: A | Discharge status: Home / Self Care | Payer: PPO | Attending: Family Medicine | Admitting: Family Medicine

## 2021-09-05 ENCOUNTER — Other Ambulatory Visit: Payer: Self-pay | Admitting: Family Medicine

## 2021-09-05 ENCOUNTER — Ambulatory Visit
Admission: RE | Admit: 2021-09-05 | Discharge: 2021-09-05 | Disposition: A | Discharge status: Home / Self Care | Payer: PPO | Source: Ambulatory Visit

## 2021-09-05 ENCOUNTER — Ambulatory Visit (INDEPENDENT_AMBULATORY_CARE_PROVIDER_SITE_OTHER): Payer: PPO | Admitting: Family Medicine

## 2021-09-05 VITALS — BP 138/68 | HR 68 | Ht 60.0 in | Wt 141.4 lb

## 2021-09-05 DIAGNOSIS — M25561 Pain in right knee: Secondary | ICD-10-CM

## 2021-09-05 DIAGNOSIS — G8929 Other chronic pain: Secondary | ICD-10-CM

## 2021-09-05 DIAGNOSIS — I10 Essential (primary) hypertension: Secondary | ICD-10-CM

## 2021-09-05 DIAGNOSIS — M2351 Chronic instability of knee, right knee: Secondary | ICD-10-CM

## 2021-09-05 DIAGNOSIS — R7309 Other abnormal glucose: Secondary | ICD-10-CM

## 2021-09-05 DIAGNOSIS — E782 Mixed hyperlipidemia: Secondary | ICD-10-CM

## 2021-09-05 DIAGNOSIS — Z0000 Encounter for general adult medical examination without abnormal findings: Secondary | ICD-10-CM

## 2021-09-05 DIAGNOSIS — M1711 Unilateral primary osteoarthritis, right knee: Secondary | ICD-10-CM | POA: Diagnosis not present

## 2021-09-05 DIAGNOSIS — E663 Overweight: Secondary | ICD-10-CM

## 2021-09-05 DIAGNOSIS — E559 Vitamin D deficiency, unspecified: Secondary | ICD-10-CM

## 2021-09-05 LAB — POCT GLYCOSYLATED HEMOGLOBIN (HGB A1C): Hemoglobin A1C: 5.6 % (ref 4.0–5.6)

## 2021-09-05 NOTE — Assessment & Plan Note (Signed)
Elevated BP in office today, with anxiety/stress Home reading reviewed - normal readings No known complications   Plan:  1. Continue current BP regimen - HCTZ '25mg'$  daily, Losartan '100mg'$ , Amlodipine '5mg'$  daily 2. Encourage improved lifestyle - low sodium diet, regular exercise 3. Continue monitor BP outside office, bring readings to next visit, if persistently >140/90 or new symptoms notify office sooner 4. Follow-up 6 months  May consider inc Amlodipine from 5 to 10 in future

## 2021-09-05 NOTE — Progress Notes (Signed)
Subjective:    Patient ID: Heidi Bell, female    DOB: [birth year], 71 y.o.   MRN: [id number]  Heidi Bell is a 71 y.o. female presenting on 09/05/2021 for Prediabetes and Hypertension   HPI  Elevated A1c / Pre-Diabetes A1c 5.6 normalized result, past several years 5.5 to 5.8 She attributes improvement to lifestyle changes. No history of DM Not checking Sugar Never on medication   CHRONIC HTN: Last home BP reading normal range. This morning she was more active and attributes raised BP on that. Current Meds - HCTZ '25mg'$  daily, Losartan '100mg'$ , Amlodipine '5mg'$  daily - OFF Potassium supplement Reports good compliance, took meds today. Tolerating well, w/o complaints. Admits occasional dizzy episode when forward bending, temporary episode. Denies CP, dyspnea, HA, edema, lightheadedness    Vitamin D Deficiency Last lab was normal 33. Taking OTC Vitamin D   Chronic Anxiety Stable on Sertraline Admits white coat hypertension     Additional concerns:  Numbness Fingertips Admits some cramping of hands fingers and numbness in tips of fingers. Using OTC Voltaren topical, some relief. Difficulty keeping it on all the time. Using it more at night. Not using Tylenol    R Knee Instability / Pain Reports she was dx with arthritis in R knee years ago with swelling and saw orthopedic with injection with resolution and problem was gone. She has remained active, now recently describes some episodic flares with walking and bending she will have a instant moment of R knee instability and some associated pain, feels like knee "gives way" and then comes back and causes some discomfort. Not using knee brace or sleeve. Tried topical.       Health Maintenance:   Declines all vaccines   Updated Mammogram and Colonoscopy 2023.     09/05/2021    8:52 AM 07/27/2021    2:41 PM 03/06/2021    8:35 AM  Depression screen PHQ 2/9  Decreased Interest 0 0 0  Down, Depressed, Hopeless 1 0 0  PHQ -  2 Score 1 0 0  Altered sleeping 0  0  Tired, decreased energy 1  0  Change in appetite 0  0  Feeling bad or failure about yourself  0  0  Trouble concentrating 1  0  Moving slowly or fidgety/restless 1  0  Suicidal thoughts 0  0  PHQ-9 Score 4  0  Difficult doing work/chores Not difficult at all  Not difficult at all    Social History   Tobacco Use   Smoking status: Never   Smokeless tobacco: Never  Vaping Use   Vaping Use: Never used  Substance Use Topics   Alcohol use: No    Alcohol/week: 0.0 standard drinks   Drug use: No    Review of Systems Per HPI unless specifically indicated above     Objective:    BP 138/68 (BP Location: Left Arm, Cuff Size: Normal)   Pulse 68   Ht 5' (1.524 m)   Wt 141 lb 6.4 oz (64.1 kg)   SpO2 99%   BMI 27.62 kg/m   Wt Readings from Last 3 Encounters:  09/05/21 141 lb 6.4 oz (64.1 kg)  07/27/21 139 lb 6.4 oz (63.2 kg)  04/06/21 135 lb (61.2 kg)    Physical Exam Vitals and nursing note reviewed.  Constitutional:      General: She is not in acute distress.    Appearance: She is well-developed. She is not diaphoretic.     Comments: Well-appearing, comfortable,  cooperative  HENT:     Head: Normocephalic and atraumatic.  Eyes:     General:        Right eye: No discharge.        Left eye: No discharge.     Conjunctiva/sclera: Conjunctivae normal.  Neck:     Thyroid: No thyromegaly.  Cardiovascular:     Rate and Rhythm: Normal rate and regular rhythm.     Heart sounds: Normal heart sounds. No murmur heard. Pulmonary:     Effort: Pulmonary effort is normal. No respiratory distress.     Breath sounds: Normal breath sounds. No wheezing or rales.  Musculoskeletal:        General: Normal range of motion.     Cervical back: Normal range of motion and neck supple.     Comments: Bilateral hands with some CMC joint bulkiness MCP  Lymphadenopathy:     Cervical: No cervical adenopathy.  Skin:    General: Skin is warm and dry.      Findings: No erythema or rash.  Neurological:     Mental Status: She is alert and oriented to person, place, and time.  Psychiatric:        Behavior: Behavior normal.     Comments: Well groomed, good eye contact, normal speech and thoughts      Results for orders placed or performed in visit on 09/05/21  POCT HgB A1C  Result Value Ref Range   Hemoglobin A1C 5.6 4.0 - 5.6 %      Assessment & Plan:   Problem List Items Addressed This Visit     Essential hypertension    Elevated BP in office today, with anxiety/stress Home reading reviewed - normal readings No known complications   Plan:  1. Continue current BP regimen - HCTZ '25mg'$  daily, Losartan '100mg'$ , Amlodipine '5mg'$  daily 2. Encourage improved lifestyle - low sodium diet, regular exercise 3. Continue monitor BP outside office, bring readings to next visit, if persistently >140/90 or new symptoms notify office sooner 4. Follow-up 6 months  May consider inc Amlodipine from 5 to 10 in future       Elevated hemoglobin A1c - Primary    Stable A1c 5.6, prior 5.5 Improving diet Concern with HTN, HLD  Plan:  1. Not on any therapy currently  2. Encourage improved lifestyle - low carb, low sugar diet, reduce portion size, continue improving regular exercise      Relevant Orders   POCT HgB A1C (Completed)   Chronic pain of right knee   Relevant Orders   DG Knee Complete 4 Views Right (Completed)   Other Visit Diagnoses     Recurrent right knee instability       Relevant Orders   DG Knee Complete 4 Views Right (Completed)       For swelling inflammation, START anti inflammatory topical - OTC Voltaren (generic Diclofenac) topical 2-4 times a day as needed for pain swelling of affected joint for 1-2 weeks or longer.  Only if needed for Pain - Recommend to start taking Tylenol Extra Strength '500mg'$  tabs - take 1 to 2 tabs per dose (max '1000mg'$ ) every 6-8 hours for pain (take regularly), max 24 hour daily dose is 6 tablets  or '3000mg'$ . In the future you can repeat the same everyday Tylenol course for 1-2 weeks at a time.  Knee X-ray ordered today Stay tuned for updates  Orders Placed This Encounter  Procedures   DG Knee Complete 4 Views Right    Standing  Status:   Future    Number of Occurrences:   1    Standing Expiration Date:   09/06/2022    Order Specific Question:   Reason for Exam (SYMPTOM  OR DIAGNOSIS REQUIRED)    Answer:   chronic right knee pain and instability    Order Specific Question:   Preferred imaging location?    Answer:   [facility]   POCT HgB A1C     No orders of the defined types were placed in this encounter.   Follow up plan: Return in about 6 months (around 03/07/2022) for 6 fasting lab only then 1 week later Annual Physical.    Nobie Putnam, DO [facility] 09/05/2021, 8:38 AM

## 2021-09-05 NOTE — Patient Instructions (Addendum)
Thank you for coming to the office today.  Arthritis of the hands.  For swelling inflammation, START anti inflammatory topical - OTC Voltaren (generic Diclofenac) topical 2-4 times a day as needed for pain swelling of affected joint for 1-2 weeks or longer.  Only if needed for Pain - Recommend to start taking Tylenol Extra Strength '500mg'$  tabs - take 1 to 2 tabs per dose (max '1000mg'$ ) every 6-8 hours for pain (take regularly), max 24 hour daily dose is 6 tablets or '3000mg'$ . In the future you can repeat the same everyday Tylenol course for 1-2 weeks at a time.  Knee X-ray ordered today Stay tuned for updates  Recent Labs    02/27/21 0819 09/05/21 0839  HGBA1C 5.7* 5.6   DUE for FASTING BLOOD WORK (no food or drink after midnight before the lab appointment, only water or coffee without cream/sugar on the morning of)  SCHEDULE "Lab Only" visit in the morning at the clinic for lab draw in 6 MONTHS   - Make sure Lab Only appointment is at about 1 week before your next appointment, so that results will be available  For Lab Results, once available within 2-3 days of blood draw, you can can log in to MyChart online to view your results and a brief explanation. Also, we can discuss results at next follow-up visit.   Please schedule a Follow-up Appointment to: Return in about 6 months (around 03/07/2022) for 6 fasting lab only then 1 week later Annual Physical.  If you have any other questions or concerns, please feel free to call the office or send a message through [place]. You may also schedule an earlier appointment if necessary.  Additionally, you may be receiving a survey about your experience at our office within a few days to 1 week by e-mail or mail. We value your feedback.  Nobie Putnam, DO [place]

## 2021-09-05 NOTE — Assessment & Plan Note (Signed)
Stable A1c 5.6, prior 5.5 Improving diet Concern with HTN, HLD  Plan:  1. Not on any therapy currently  2. Encourage improved lifestyle - low carb, low sugar diet, reduce portion size, continue improving regular exercise

## 2021-10-25 ENCOUNTER — Ambulatory Visit: Payer: PPO | Admitting: Dermatology

## 2021-10-25 DIAGNOSIS — L814 Other melanin hyperpigmentation: Secondary | ICD-10-CM | POA: Diagnosis not present

## 2021-10-25 DIAGNOSIS — L821 Other seborrheic keratosis: Secondary | ICD-10-CM

## 2021-10-25 DIAGNOSIS — D1800 Hemangioma unspecified site: Secondary | ICD-10-CM

## 2021-10-25 DIAGNOSIS — B353 Tinea pedis: Secondary | ICD-10-CM | POA: Diagnosis not present

## 2021-10-25 DIAGNOSIS — L578 Other skin changes due to chronic exposure to nonionizing radiation: Secondary | ICD-10-CM

## 2021-10-25 DIAGNOSIS — Z1283 Encounter for screening for malignant neoplasm of skin: Secondary | ICD-10-CM

## 2021-10-25 DIAGNOSIS — Z86018 Personal history of other benign neoplasm: Secondary | ICD-10-CM | POA: Diagnosis not present

## 2021-10-25 DIAGNOSIS — D229 Melanocytic nevi, unspecified: Secondary | ICD-10-CM | POA: Diagnosis not present

## 2021-10-25 DIAGNOSIS — Z85828 Personal history of other malignant neoplasm of skin: Secondary | ICD-10-CM

## 2021-10-25 MED ORDER — KETOCONAZOLE 2 % EX CREA: TOPICAL_CREAM | CUTANEOUS | 11 refills | Status: DC

## 2021-10-25 NOTE — Patient Instructions (Signed)
Due to recent changes in healthcare laws, you may see results of your pathology and/or laboratory studies on MyChart before the doctors have had a chance to review them. We understand that in some cases there may be results that are confusing or concerning to you. Please understand that not all results are received at the same time and often the doctors may need to interpret multiple results in order to provide you with the best plan of care or course of treatment. Therefore, we ask that you please give us 2 business days to thoroughly review all your results before contacting the office for clarification. Should we see a critical lab result, you will be contacted sooner.   If You Need Anything After Your Visit  If you have any questions or concerns for your doctor, please call our main line at [phone] and press option 4 to reach your doctor's medical assistant. If no one answers, please leave a voicemail as directed and we will return your call as soon as possible. Messages left after 4 pm will be answered the following business day.   You may also send us a message via MyChart. We typically respond to MyChart messages within 1-2 business days.  For prescription refills, please ask your pharmacy to contact our office. Our fax number is [phone].  If you have an urgent issue when the clinic is closed that cannot wait until the next business day, you can page your doctor at the number below.    Please note that while we do our best to be available for urgent issues outside of office hours, we are not available 24/7.   If you have an urgent issue and are unable to reach us, you may choose to seek medical care at your doctor's office, retail clinic, urgent care center, or emergency room.  If you have a medical emergency, please immediately call 911 or go to the emergency department.  Pager Numbers  - Dr. Kowalski: [phone]  - Dr. Moye: [phone]  - Dr. Stewart:  [phone]  In the event of inclement weather, please call our main line at [phone] for an update on the status of any delays or closures.  Dermatology Medication Tips: Please keep the boxes that topical medications come in in order to help keep track of the instructions about where and how to use these. Pharmacies typically print the medication instructions only on the boxes and not directly on the medication tubes.   If your medication is too expensive, please contact our office at [phone] option 4 or send us a message through MyChart.   We are unable to tell what your co-pay for medications will be in advance as this is different depending on your insurance coverage. However, we may be able to find a substitute medication at lower cost or fill out paperwork to get insurance to cover a needed medication.   If a prior authorization is required to get your medication covered by your insurance company, please allow us 1-2 business days to complete this process.  Drug prices often vary depending on where the prescription is filled and some pharmacies may offer cheaper prices.  The website www.goodrx.com contains coupons for medications through different pharmacies. The prices here do not account for what the cost may be with help from insurance (it may be cheaper with your insurance), but the website can give you the price if you did not use any insurance.  - You can print the associated coupon and take it with   your prescription to the pharmacy.  - You may also stop by our office during regular business hours and pick up a GoodRx coupon card.  - If you need your prescription sent electronically to a different pharmacy, notify our office through [facility] MyChart or by phone at [phone] option 4.     Si Usted Necesita Algo Despus de Su Visita  Tambin puede enviarnos un mensaje a travs de MyChart. Por lo general respondemos a los mensajes de MyChart en el transcurso de 1 a 2  das hbiles.  Para renovar recetas, por favor pida a su farmacia que se ponga en contacto con nuestra oficina. Nuestro nmero de fax es el [phone].  Si tiene un asunto urgente cuando la clnica est cerrada y que no puede esperar hasta el siguiente da hbil, puede llamar/localizar a su doctor(a) al nmero que aparece a continuacin.   Por favor, tenga en cuenta que aunque hacemos todo lo posible para estar disponibles para asuntos urgentes fuera del horario de oficina, no estamos disponibles las 24 horas del da, los 7 das de la semana.   Si tiene un problema urgente y no puede comunicarse con nosotros, puede optar por buscar atencin mdica  en el consultorio de su doctor(a), en una clnica privada, en un centro de atencin urgente o en una sala de emergencias.  Si tiene una emergencia mdica, por favor llame inmediatamente al 911 o vaya a la sala de emergencias.  Nmeros de bper  - Dr. Kowalski: [phone]  - Dra. Moye: [phone]  - Dra. Stewart: [phone]  En caso de inclemencias del tiempo, por favor llame a nuestra lnea principal al [phone] para una actualizacin sobre el estado de cualquier retraso o cierre.  Consejos para la medicacin en dermatologa: Por favor, guarde las cajas en las que vienen los medicamentos de uso tpico para ayudarle a seguir las instrucciones sobre dnde y cmo usarlos. Las farmacias generalmente imprimen las instrucciones del medicamento slo en las cajas y no directamente en los tubos del medicamento.   Si su medicamento es muy caro, por favor, pngase en contacto con nuestra oficina llamando al [phone] y presione la opcin 4 o envenos un mensaje a travs de MyChart.   No podemos decirle cul ser su copago por los medicamentos por adelantado ya que esto es diferente dependiendo de la cobertura de su seguro. Sin embargo, es posible que podamos encontrar un medicamento sustituto a menor costo o llenar un formulario para que el  seguro cubra el medicamento que se considera necesario.   Si se requiere una autorizacin previa para que su compaa de seguros cubra su medicamento, por favor permtanos de 1 a 2 das hbiles para completar este proceso.  Los precios de los medicamentos varan con frecuencia dependiendo del lugar de dnde se surte la receta y alguna farmacias pueden ofrecer precios ms baratos.  El sitio web www.goodrx.com tiene cupones para medicamentos de diferentes farmacias. Los precios aqu no tienen en cuenta lo que podra costar con la ayuda del seguro (puede ser ms barato con su seguro), pero el sitio web puede darle el precio si no utiliz ningn seguro.  - Puede imprimir el cupn correspondiente y llevarlo con su receta a la farmacia.  - Tambin puede pasar por nuestra oficina durante el horario de atencin regular y recoger una tarjeta de cupones de GoodRx.  - Si necesita que su receta se enve electrnicamente a una farmacia diferente, informe a nuestra oficina a travs de MyChart de [place]   o por telfono llamando al [phone] y presione la opcin 4.  

## 2021-10-25 NOTE — Progress Notes (Signed)
Follow-Up Visit   Subjective  Heidi Bell is a 71 y.o. female who presents for the following: Total body skin exam (Hx of BCC L cheek, hx of Dysplastic Nevus L mid lat back near braline, hx of AKs). The patient presents for Total-Body Skin Exam (TBSE) for skin cancer screening and mole check.  The patient has spots, moles and lesions to be evaluated, some may be new or changing.  The following portions of the chart were reviewed this encounter and updated as appropriate:   Tobacco  Allergies  Meds  Problems  Med Hx  Surg Hx  Fam Hx     Review of Systems:  No other skin or systemic complaints except as noted in HPI or Assessment and Plan.  Objective  Well appearing patient in no apparent distress; mood and affect are within normal limits.  A full examination was performed including scalp, head, eyes, ears, nose, lips, neck, chest, axillae, abdomen, back, buttocks, bilateral upper extremities, bilateral lower extremities, hands, feet, fingers, toes, fingernails, and toenails. All findings within normal limits unless otherwise noted below.  B/L foot Scaling and maceration web spaces and over distal and lateral soles.    Assessment & Plan  Tinea pedis of both feet B/L foot Chronic and persistent condition with duration or expected duration over one year. Condition is symptomatic / bothersome to patient. Not to goal. Start Ketoconazole 2% cream QHS.   ketoconazole (NIZORAL) 2 % cream - B/L foot Apply to the feet QHS.  Lentigines - Scattered tan macules - Due to sun exposure - Benign-appearing, observe - Recommend daily broad spectrum sunscreen SPF 30+ to sun-exposed areas, reapply every 2 hours as needed. - Call for any changes  Seborrheic Keratoses - Stuck-on, waxy, tan-brown papules and/or plaques  - Benign-appearing - Discussed benign etiology and prognosis. - Observe - Call for any changes  Melanocytic Nevi - Tan-brown and/or pink-flesh-colored symmetric  macules and papules - Benign appearing on exam today - Observation - Call clinic for new or changing moles - Recommend daily use of broad spectrum spf 30+ sunscreen to sun-exposed areas.   Hemangiomas - Red papules - Discussed benign nature - Observe - Call for any changes  Actinic Damage - Chronic condition, secondary to cumulative UV/sun exposure - diffuse scaly erythematous macules with underlying dyspigmentation - Recommend daily broad spectrum sunscreen SPF 30+ to sun-exposed areas, reapply every 2 hours as needed.  - Staying in the shade or wearing long sleeves, sun glasses (UVA+UVB protection) and wide brim hats (4-inch brim around the entire circumference of the hat) are also recommended for sun protection.  - Call for new or changing lesions.  History of Basal Cell Carcinoma of the Skin - No evidence of recurrence today - Recommend regular full body skin exams - Recommend daily broad spectrum sunscreen SPF 30+ to sun-exposed areas, reapply every 2 hours as needed.  - Call if any new or changing lesions are noted between office visits  History of Dysplastic Nevi - No evidence of recurrence today - Recommend regular full body skin exams - Recommend daily broad spectrum sunscreen SPF 30+ to sun-exposed areas, reapply every 2 hours as needed.  - Call if any new or changing lesions are noted between office visits  Skin cancer screening performed today.  Return in about 1 year (around 10/26/2022) for TBSE.  Luther Redo, CMA, am acting as scribe for Sarina Ser, MD . Documentation: I have reviewed the above documentation for accuracy and completeness, and I  agree with the above.  Sarina Ser, MD

## 2021-10-29 ENCOUNTER — Encounter: Payer: Self-pay | Admitting: Dermatology

## 2021-12-20 ENCOUNTER — Encounter: Payer: Self-pay | Admitting: Family Medicine

## 2021-12-20 DIAGNOSIS — F419 Anxiety disorder, unspecified: Secondary | ICD-10-CM

## 2021-12-20 DIAGNOSIS — I10 Essential (primary) hypertension: Secondary | ICD-10-CM

## 2021-12-20 MED ORDER — LOSARTAN POTASSIUM 100 MG PO TABS: 100.0000 mg | ORAL_TABLET | Freq: Every day | ORAL | 3 refills | Status: DC

## 2021-12-20 MED ORDER — SERTRALINE HCL 50 MG PO TABS: 50.0000 mg | ORAL_TABLET | Freq: Every day | ORAL | 3 refills | Status: DC

## 2021-12-20 MED ORDER — HYDROCHLOROTHIAZIDE 25 MG PO TABS: 25.0000 mg | ORAL_TABLET | Freq: Every day | ORAL | 3 refills | Status: DC

## 2021-12-20 MED ORDER — AMLODIPINE BESYLATE 5 MG PO TABS: 5.0000 mg | ORAL_TABLET | Freq: Every day | ORAL | 3 refills | Status: DC

## 2022-02-12 ENCOUNTER — Other Ambulatory Visit: Payer: Self-pay

## 2022-02-12 DIAGNOSIS — E782 Mixed hyperlipidemia: Secondary | ICD-10-CM

## 2022-02-12 MED ORDER — ROSUVASTATIN CALCIUM 10 MG PO TABS: 10.0000 mg | ORAL_TABLET | Freq: Every day | ORAL | 3 refills | Status: DC

## 2022-02-27 ENCOUNTER — Other Ambulatory Visit: Payer: Self-pay

## 2022-02-27 DIAGNOSIS — E559 Vitamin D deficiency, unspecified: Secondary | ICD-10-CM

## 2022-02-27 DIAGNOSIS — E663 Overweight: Secondary | ICD-10-CM

## 2022-02-27 DIAGNOSIS — Z0000 Encounter for general adult medical examination without abnormal findings: Secondary | ICD-10-CM

## 2022-02-27 DIAGNOSIS — R7309 Other abnormal glucose: Secondary | ICD-10-CM

## 2022-02-27 DIAGNOSIS — E782 Mixed hyperlipidemia: Secondary | ICD-10-CM

## 2022-02-27 DIAGNOSIS — I10 Essential (primary) hypertension: Secondary | ICD-10-CM

## 2022-02-28 ENCOUNTER — Other Ambulatory Visit: Payer: PPO

## 2022-02-28 DIAGNOSIS — R7309 Other abnormal glucose: Secondary | ICD-10-CM | POA: Diagnosis not present

## 2022-02-28 DIAGNOSIS — E782 Mixed hyperlipidemia: Secondary | ICD-10-CM | POA: Diagnosis not present

## 2022-02-28 DIAGNOSIS — Z0000 Encounter for general adult medical examination without abnormal findings: Secondary | ICD-10-CM | POA: Diagnosis not present

## 2022-02-28 DIAGNOSIS — E559 Vitamin D deficiency, unspecified: Secondary | ICD-10-CM | POA: Diagnosis not present

## 2022-02-28 DIAGNOSIS — E663 Overweight: Secondary | ICD-10-CM | POA: Diagnosis not present

## 2022-02-28 DIAGNOSIS — I10 Essential (primary) hypertension: Secondary | ICD-10-CM | POA: Diagnosis not present

## 2022-03-01 LAB — COMPLETE METABOLIC PANEL WITH GFR
AG Ratio: 1.7 (calc) (ref 1.0–2.5)
ALT: 19 U/L (ref 6–29)
AST: 27 U/L (ref 10–35)
Albumin: 4.8 g/dL (ref 3.6–5.1)
Alkaline phosphatase (APISO): 75 U/L (ref 37–153)
BUN: 19 mg/dL (ref 7–25)
CO2: 32 mmol/L (ref 20–32)
Calcium: 10.2 mg/dL (ref 8.6–10.4)
Chloride: 103 mmol/L (ref 98–110)
Creat: 0.66 mg/dL (ref 0.60–1.00)
Globulin: 2.8 g/dL (calc) (ref 1.9–3.7)
Glucose, Bld: 101 mg/dL — ABNORMAL HIGH (ref 65–99)
Potassium: 4.1 mmol/L (ref 3.5–5.3)
Sodium: 143 mmol/L (ref 135–146)
Total Bilirubin: 1.1 mg/dL (ref 0.2–1.2)
Total Protein: 7.6 g/dL (ref 6.1–8.1)
eGFR: 93 mL/min/{1.73_m2} (ref >=60)

## 2022-03-01 LAB — CBC WITH DIFFERENTIAL/PLATELET
Absolute Monocytes: 564 cells/uL (ref 200–950)
Basophils Absolute: 42 cells/uL (ref 0–200)
Basophils Relative: 0.7 %
Eosinophils Absolute: 60 cells/uL (ref 15–500)
Eosinophils Relative: 1 %
HCT: 38.2 % (ref 35.0–45.0)
Hemoglobin: 12.8 g/dL (ref 11.7–15.5)
Lymphs Abs: 1518 cells/uL (ref 850–3900)
MCH: 30.4 pg (ref 27.0–33.0)
MCHC: 33.5 g/dL (ref 32.0–36.0)
MCV: 90.7 fL (ref 80.0–100.0)
MPV: 12 fL (ref 7.5–12.5)
Monocytes Relative: 9.4 %
Neutro Abs: 3816 cells/uL (ref 1500–7800)
Neutrophils Relative %: 63.6 %
Platelets: 187 10*3/uL (ref 140–400)
RBC: 4.21 10*6/uL (ref 3.80–5.10)
RDW: 12.2 % (ref 11.0–15.0)
Total Lymphocyte: 25.3 %
WBC: 6 10*3/uL (ref 3.8–10.8)

## 2022-03-01 LAB — LIPID PANEL
Cholesterol: 155 mg/dL (ref <200)
HDL: 81 mg/dL (ref >=50)
LDL Cholesterol (Calc): 58 mg/dL (calc)
Non-HDL Cholesterol (Calc): 74 mg/dL (calc) (ref <130)
Total CHOL/HDL Ratio: 1.9 (calc) (ref <5.0)
Triglycerides: 79 mg/dL (ref <150)

## 2022-03-01 LAB — HEMOGLOBIN A1C
Hgb A1c MFr Bld: 5.8 % of total Hgb — ABNORMAL HIGH (ref <5.7)
Mean Plasma Glucose: 120 mg/dL
eAG (mmol/L): 6.6 mmol/L

## 2022-03-01 LAB — TSH: TSH: 1.95 mIU/L (ref 0.40–4.50)

## 2022-03-01 LAB — VITAMIN D 25 HYDROXY (VIT D DEFICIENCY, FRACTURES): Vit D, 25-Hydroxy: 28 ng/mL — ABNORMAL LOW (ref 30–100)

## 2022-03-08 ENCOUNTER — Encounter: Payer: Self-pay | Admitting: Family Medicine

## 2022-03-08 ENCOUNTER — Ambulatory Visit (INDEPENDENT_AMBULATORY_CARE_PROVIDER_SITE_OTHER): Payer: PPO | Admitting: Family Medicine

## 2022-03-08 VITALS — BP 144/60 | HR 72 | Ht 60.0 in | Wt 139.0 lb

## 2022-03-08 DIAGNOSIS — Z0000 Encounter for general adult medical examination without abnormal findings: Secondary | ICD-10-CM | POA: Diagnosis not present

## 2022-03-08 DIAGNOSIS — E782 Mixed hyperlipidemia: Secondary | ICD-10-CM

## 2022-03-08 DIAGNOSIS — E559 Vitamin D deficiency, unspecified: Secondary | ICD-10-CM | POA: Diagnosis not present

## 2022-03-08 DIAGNOSIS — Z1231 Encounter for screening mammogram for malignant neoplasm of breast: Secondary | ICD-10-CM | POA: Diagnosis not present

## 2022-03-08 DIAGNOSIS — R7309 Other abnormal glucose: Secondary | ICD-10-CM | POA: Diagnosis not present

## 2022-03-08 DIAGNOSIS — E663 Overweight: Secondary | ICD-10-CM

## 2022-03-08 DIAGNOSIS — I10 Essential (primary) hypertension: Secondary | ICD-10-CM

## 2022-03-08 NOTE — Assessment & Plan Note (Addendum)
Elevated BP in office today, with anxiety/stress but improved on repeat Home reading reviewed - normal readings No known complications   Plan:  1. Continue current BP regimen - HCTZ '25mg'$  daily, Losartan '100mg'$ , Amlodipine '5mg'$  daily 2. Encourage improved lifestyle - low sodium diet, regular exercise 3. Continue monitor BP outside office, bring readings to next visit, if persistently >140/90 or new symptoms notify office sooner 4. Follow-up 6 months  May consider inc Amlodipine from 5 to 10 in future, she prefers to avoid edema

## 2022-03-08 NOTE — Assessment & Plan Note (Signed)
Well controlled lipids on statin Last lipid panel 03/2022 OFF Red Yeast Rice  Plan: 1. Continue Rosuvastatin '10mg'$  nightly - Continue Omega 3 '1000mg'$  daily 2. Encourage improved lifestyle - low carb/cholesterol, reduce portion size, continue improving regular exercise

## 2022-03-08 NOTE — Progress Notes (Signed)
Subjective:    Patient ID: Heidi Bell, female    DOB: January 21, 1951, 71 y.o.   MRN: [id number]  Heidi Bell is a 71 y.o. female presenting on 03/08/2022 for Annual Exam   HPI  Here for Annual Physical and Lab Review  Elevated A1c / Pre-Diabetes A1c 5.8 previously 5.5 to 5.8 She attributes improvement to lifestyle changes. No history of DM Not checking Sugar Never on medication   CHRONIC HTN: Last home BP reading normal range recently 130-140. This morning she was more active and attributes raised BP on that. Current Meds - HCTZ 57m daily, Losartan 1076m Amlodipine 86m6maily - OFF Potassium supplement Reports good compliance, took meds today. Tolerating well, w/o complaints. Admits occasional dizzy episode when forward bending, temporary episode. Denies CP, dyspnea, HA, edema, lightheadedness    Vitamin D Deficiency Last lab was Mild Low 28. Taking OTC Vitamin D   Chronic Anxiety Stable on Sertraline Admits white coat hypertension   HYPERLIPIDEMIA: - Reports no concerns. Last lipid panel 03/2022, controlled LDL 50s - Currently taking Rosuvastatin 33m71mghtly, tolerating well without side effects or myalgias     Health Maintenance:   Declines all vaccines  Completed Colonoscopy 04/06/21 repeat 7 years.   Mammogram completed 05/2021     03/08/2022   10:12 AM 09/05/2021    8:52 AM 07/27/2021    2:41 PM  Depression screen PHQ 2/9  Decreased Interest 0 0 0  Down, Depressed, Hopeless 1 1 0  PHQ - 2 Score 1 1 0  Altered sleeping 0 0   Tired, decreased energy 1 1   Change in appetite 0 0   Feeling bad or failure about yourself  0 0   Trouble concentrating 1 1   Moving slowly or fidgety/restless 0 1   Suicidal thoughts 0 0   PHQ-9 Score 3 4   Difficult doing work/chores Not difficult at all Not difficult at all     Past Medical History:  Diagnosis Date   Anxiety    Atypical mole 06/16/2018   L mid lat back near braline/mod   Basal cell carcinoma  04/22/2018   L cheek/excision   Hyperlipidemia    Hypertension    Pre-diabetes    Past Surgical History:  Procedure Laterality Date   ABDOMINAL HYSTERECTOMY     boil removed  1984   BREAST CYST ASPIRATION Left 1994   Dr. ByrnBary CastillaREAST CYST ASPIRATION Left 10/2017   COLONOSCOPY WITH PROPOFOL N/A 01/12/2015   Procedure: COLONOSCOPY WITH PROPOFOL;  Surgeon: SeepChristene Lye;  Location: [facility] ENDOSCOPY;  Service: Endoscopy;  Laterality: N/A;   COLONOSCOPY WITH PROPOFOL N/A 04/06/2021   Procedure: COLONOSCOPY WITH PROPOFOL;  Surgeon: WohlLucilla Lame;  [facility];  Laterality: N/A;   POLYPECTOMY  04/06/2021   Procedure: POLYPECTOMY;  Surgeon: WohlLucilla Lame;  [facility];;   SKIN CANCER EXCISION     left shoulder, mid back and left cheek.    Social History   Socioeconomic History   Marital status: Single    Spouse name: Not on file   Number of children: 0   Years of education: Not on file   Highest education level: High school graduate  Occupational History   Occupation: retired  Tobacco Use   Smoking status: Never   Smokeless tobacco: Never  Vaping Use   Vaping Use: Never used  Substance and Sexual Activity   Alcohol use: No  Alcohol/week: 0.0 standard drinks of alcohol   Drug use: No   Sexual activity: Not on file  Other Topics Concern   Not on file  Social History Narrative   Not on file   Social Determinants of Health   Financial Resource Strain: Low Risk  (07/27/2021)   Overall Financial Resource Strain (CARDIA)    Difficulty of Paying Living Expenses: Not hard at all  Food Insecurity: No Food Insecurity (07/27/2021)   Hunger Vital Sign    Worried About Running Out of Food in the Last Year: Never true    Ran Out of Food in the Last Year: Never true  Transportation Needs: No Transportation Needs (07/27/2021)   PRAPARE - Hydrologist (Medical): No     Lack of Transportation (Non-Medical): No  Physical Activity: Insufficiently Active (07/27/2021)   Exercise Vital Sign    Days of Exercise per Week: 3 days    Minutes of Exercise per Session: 10 min  Stress: No Stress Concern Present (07/27/2021)   [place]    Feeling of Stress : Only a little  Social Connections: Unknown (07/27/2021)   Social Connection and Isolation Panel [NHANES]    Frequency of Communication with Friends and Family: Twice a week    Frequency of Social Gatherings with Friends and Family: More than three times a week    Attends Religious Services: Never    Marine scientist or Organizations: No    Attends Archivist Meetings: Never    Marital Status: Patient refused  Intimate Partner Violence: Not At Risk (07/27/2021)   Humiliation, Afraid, Rape, and Kick questionnaire    Fear of Current or Ex-Partner: No    Emotionally Abused: No    Physically Abused: No    Sexually Abused: No   Family History  Problem Relation Age of Onset   Colon cancer Father    COPD Mother    Lung cancer Mother    Bleeding Disorder Mother    Breast cancer Sister    Current Outpatient Medications on File Prior to Visit  Medication Sig   amLODipine (NORVASC) 5 MG tablet Take 1 tablet (5 mg total) by mouth daily.   Ascorbic Acid (VITAMIN C) 500 MG CAPS Take 500 mg by mouth daily. (Patient taking differently: Take 1,000 mg by mouth daily.)   Calcium Carb-Cholecalciferol 600-800 MG-UNIT TABS Take by mouth.   Garlic 3291 MG CAPS Take by mouth 2 (two) times daily.    glucosamine-chondroitin 500-400 MG tablet Take 1 tablet by mouth 2 (two) times daily. (Patient taking differently: Take 1 tablet by mouth 2 (two) times daily.)   hydrochlorothiazide (HYDRODIURIL) 25 MG tablet Take 1 tablet (25 mg total) by mouth daily.   ketoconazole (NIZORAL) 2 % cream Apply to the feet QHS.   losartan (COZAAR) 100 MG tablet Take 1  tablet (100 mg total) by mouth daily.   Multiple Vitamins-Minerals (MULTIVITAMIN WITH MINERALS) tablet Take 1 tablet by mouth daily.   pyridOXINE (VITAMIN B-6) 100 MG tablet Take 100 mg by mouth daily.   rosuvastatin (CRESTOR) 10 MG tablet Take 1 tablet (10 mg total) by mouth at bedtime.   sertraline (ZOLOFT) 50 MG tablet Take 1 tablet (50 mg total) by mouth daily.   No current facility-administered medications on file prior to visit.    Review of Systems  Constitutional:  Negative for activity change, appetite change, chills, diaphoresis, fatigue and fever.  HENT:  Negative for congestion and hearing loss.   Eyes:  Negative for visual disturbance.  Respiratory:  Negative for cough, chest tightness, shortness of breath and wheezing.   Cardiovascular:  Negative for chest pain, palpitations and leg swelling.  Gastrointestinal:  Negative for abdominal pain, constipation, diarrhea, nausea and vomiting.  Genitourinary:  Negative for dysuria, frequency and hematuria.  Musculoskeletal:  Negative for arthralgias and neck pain.  Skin:  Negative for rash.  Neurological:  Negative for dizziness, weakness, light-headedness, numbness and headaches.  Hematological:  Negative for adenopathy.  Psychiatric/Behavioral:  Negative for behavioral problems, dysphoric mood and sleep disturbance.    Per HPI unless specifically indicated above     Objective:    BP (!) 144/60 (BP Location: Left Arm, Cuff Size: Normal)   Pulse 72   Ht 5' (1.524 m)   Wt 139 lb (63 kg)   SpO2 100%   BMI 27.15 kg/m   Wt Readings from Last 3 Encounters:  03/08/22 139 lb (63 kg)  09/05/21 141 lb 6.4 oz (64.1 kg)  07/27/21 139 lb 6.4 oz (63.2 kg)    Physical Exam Vitals and nursing note reviewed.  Constitutional:      General: She is not in acute distress.    Appearance: She is well-developed. She is not diaphoretic.     Comments: Well-appearing, comfortable, cooperative  HENT:     Head: Normocephalic and atraumatic.   Eyes:     General:        Right eye: No discharge.        Left eye: No discharge.     Conjunctiva/sclera: Conjunctivae normal.     Pupils: Pupils are equal, round, and reactive to light.  Neck:     Thyroid: No thyromegaly.     Vascular: No carotid bruit.  Cardiovascular:     Rate and Rhythm: Normal rate and regular rhythm.     Pulses: Normal pulses.     Heart sounds: Normal heart sounds. No murmur heard. Pulmonary:     Effort: Pulmonary effort is normal. No respiratory distress.     Breath sounds: Normal breath sounds. No wheezing or rales.  Abdominal:     General: Bowel sounds are normal. There is no distension.     Palpations: Abdomen is soft. There is no mass.     Tenderness: There is no abdominal tenderness.  Musculoskeletal:        General: No tenderness. Normal range of motion.     Cervical back: Normal range of motion and neck supple.     Right lower leg: No edema.     Left lower leg: No edema.     Comments: Upper / Lower Extremities: - Normal muscle tone, strength bilateral upper extremities 5/5, lower extremities 5/5  Lymphadenopathy:     Cervical: No cervical adenopathy.  Skin:    General: Skin is warm and dry.     Findings: No erythema or rash.     Comments: Varicose veins bilateral lower ext, spider veins, +trace edema  Neurological:     Mental Status: She is alert and oriented to person, place, and time.     Comments: Distal sensation intact to light touch all extremities  Psychiatric:        Mood and Affect: Mood normal.        Behavior: Behavior normal.        Thought Content: Thought content normal.     Comments: Well groomed, good eye contact, normal speech and thoughts  Results for orders placed or performed in visit on 02/27/22  VITAMIN D 25 Hydroxy (Vit-D Deficiency, Fractures)  Result Value Ref Range   Vit D, 25-Hydroxy 28 (L) 30 - 100 ng/mL  TSH  Result Value Ref Range   TSH 1.95 0.40 - 4.50 mIU/L  Hemoglobin A1c  Result Value Ref Range    Hgb A1c MFr Bld 5.8 (H) <5.7 % of total Hgb   Mean Plasma Glucose 120 mg/dL   eAG (mmol/L) 6.6 mmol/L  Lipid panel  Result Value Ref Range   Cholesterol 155 <200 mg/dL   HDL 81 > OR = 50 mg/dL   Triglycerides 79 <150 mg/dL   LDL Cholesterol (Calc) 58 mg/dL (calc)   Total CHOL/HDL Ratio 1.9 <5.0 (calc)   Non-HDL Cholesterol (Calc) 74 <130 mg/dL (calc)  CBC with Differential/Platelet  Result Value Ref Range   WBC 6.0 3.8 - 10.8 Thousand/uL   RBC 4.21 3.80 - 5.10 Million/uL   Hemoglobin 12.8 11.7 - 15.5 g/dL   HCT 38.2 35.0 - 45.0 %   MCV 90.7 80.0 - 100.0 fL   MCH 30.4 27.0 - 33.0 pg   MCHC 33.5 32.0 - 36.0 g/dL   RDW 12.2 11.0 - 15.0 %   Platelets 187 140 - 400 Thousand/uL   MPV 12.0 7.5 - 12.5 fL   Neutro Abs 3,816 1,500 - 7,800 cells/uL   Lymphs Abs 1,518 850 - 3,900 cells/uL   Absolute Monocytes 564 200 - 950 cells/uL   Eosinophils Absolute 60 15 - 500 cells/uL   Basophils Absolute 42 0 - 200 cells/uL   Neutrophils Relative % 63.6 %   Total Lymphocyte 25.3 %   Monocytes Relative 9.4 %   Eosinophils Relative 1.0 %   Basophils Relative 0.7 %  COMPLETE METABOLIC PANEL WITH GFR  Result Value Ref Range   Glucose, Bld 101 (H) 65 - 99 mg/dL   BUN 19 7 - 25 mg/dL   Creat 0.66 0.60 - 1.00 mg/dL   eGFR 93 > OR = 60 mL/min/1.60m   BUN/Creatinine Ratio SEE NOTE: 6 - 22 (calc)   Sodium 143 135 - 146 mmol/L   Potassium 4.1 3.5 - 5.3 mmol/L   Chloride 103 98 - 110 mmol/L   CO2 32 20 - 32 mmol/L   Calcium 10.2 8.6 - 10.4 mg/dL   Total Protein 7.6 6.1 - 8.1 g/dL   Albumin 4.8 3.6 - 5.1 g/dL   Globulin 2.8 1.9 - 3.7 g/dL (calc)   AG Ratio 1.7 1.0 - 2.5 (calc)   Total Bilirubin 1.1 0.2 - 1.2 mg/dL   Alkaline phosphatase (APISO) 75 37 - 153 U/L   AST 27 10 - 35 U/L   ALT 19 6 - 29 U/L      Assessment & Plan:   Problem List Items Addressed This Visit     Elevated hemoglobin A1c    Elevated to A1c 5.8 Improving diet Concern with HTN, HLD  Plan:  1. Not on any therapy  currently  2. Encourage improved lifestyle - low carb, low sugar diet, reduce portion size, continue improving regular exercise      Essential hypertension    Elevated BP in office today, with anxiety/stress but improved on repeat Home reading reviewed - normal readings No known complications   Plan:  1. Continue current BP regimen - HCTZ 265mdaily, Losartan 10057mAmlodipine 5mg22mily 2. Encourage improved lifestyle - low sodium diet, regular exercise 3. Continue monitor BP outside office, bring readings to  next visit, if persistently >140/90 or new symptoms notify office sooner 4. Follow-up 6 months  May consider inc Amlodipine from 5 to 10 in future, she prefers to avoid edema      Hyperlipidemia    Well controlled lipids on statin Last lipid panel 03/2022 OFF Red Yeast Rice  Plan: 1. Continue Rosuvastatin 66m nightly - Continue Omega 3 10067mdaily 2. Encourage improved lifestyle - low carb/cholesterol, reduce portion size, continue improving regular exercise      Overweight (BMI 25.0-29.9)   Vitamin D deficiency    Mild low Vit D Continue Vitamin D + Calcium supplement Lab reviewed      Other Visit Diagnoses     Annual physical exam    -  Primary   Encounter for screening mammogram for malignant neoplasm of breast       Relevant Orders   MM 3D SCREEN BREAST BILATERAL       Updated Health Maintenance information Reviewed recent lab results with patient Encouraged improvement to lifestyle with diet and exercise Goal of weight loss  Orders Placed This Encounter  Procedures   MM 3D SCREEN BREAST BILATERAL    Standing Status:   Future    Standing Expiration Date:   03/09/2023    Order Specific Question:   Reason for Exam (SYMPTOM  OR DIAGNOSIS REQUIRED)    Answer:   Screening bilateral 3D Mammogram [place]    Order Specific Question:   Preferred imaging location?    Answer:   [place]   No orders of the defined types were placed in this  encounter.   Follow up plan: Return in about 6 months (around 09/07/2022) for 6 month PreDM A1c, HTN follow up.  AlNobie PutnamDO [facility] 03/08/2022, 8:12 AM

## 2022-03-08 NOTE — Assessment & Plan Note (Signed)
Mild low Vit D Continue Vitamin D + Calcium supplement Lab reviewed

## 2022-03-08 NOTE — Assessment & Plan Note (Signed)
Elevated to A1c 5.8 Improving diet Concern with HTN, HLD  Plan:  1. Not on any therapy currently  2. Encourage improved lifestyle - low carb, low sugar diet, reduce portion size, continue improving regular exercise

## 2022-03-08 NOTE — Patient Instructions (Addendum)
Thank you for coming to the office today.  Keep on current medications. No change today, refills on file through 12/2022  Mammogram due in early 2024  BP mild elevated 140s,  keep a close watch on it  If persistently elevated >150-160s, let me know and we can adjust.  Recent Labs    09/05/21 0839 02/28/22 0806  HGBA1C 5.6 5.8*   Keep working on improving diet limiting carb starches sugars.  Continue Vitamin D + Calcium supplement  Please schedule a Follow-up Appointment to: Return in about 6 months (around 09/07/2022) for 6 month PreDM A1c, HTN follow up.  If you have any other questions or concerns, please feel free to call the office or send a message through [place]. You may also schedule an earlier appointment if necessary.  Additionally, you may be receiving a survey about your experience at our office within a few days to 1 week by e-mail or mail. We value your feedback.  Nobie Putnam, DO [place]

## 2022-06-05 DIAGNOSIS — I10 Essential (primary) hypertension: Secondary | ICD-10-CM | POA: Diagnosis not present

## 2022-06-05 DIAGNOSIS — H02882 Meibomian gland dysfunction right lower eyelid: Secondary | ICD-10-CM | POA: Diagnosis not present

## 2022-06-05 DIAGNOSIS — H43811 Vitreous degeneration, right eye: Secondary | ICD-10-CM | POA: Diagnosis not present

## 2022-06-05 DIAGNOSIS — H2510 Age-related nuclear cataract, unspecified eye: Secondary | ICD-10-CM | POA: Diagnosis not present

## 2022-06-05 DIAGNOSIS — H02884 Meibomian gland dysfunction left upper eyelid: Secondary | ICD-10-CM | POA: Diagnosis not present

## 2022-07-19 ENCOUNTER — Telehealth: Payer: Self-pay | Admitting: Family Medicine

## 2022-07-19 NOTE — Telephone Encounter (Signed)
Contacted Heidi Bell to schedule their annual wellness visit. Appointment made for 08/09/2022.  Heidi Bell; Care Guide Ambulatory Clinical Support [place] [facility] Direct Dial: [phone]

## 2022-08-03 ENCOUNTER — Ambulatory Visit
Admission: RE | Admit: 2022-08-03 | Discharge: 2022-08-03 | Disposition: A | Discharge status: Home / Self Care | Payer: HMO | Source: Ambulatory Visit | Attending: Family Medicine | Admitting: Family Medicine

## 2022-08-03 DIAGNOSIS — Z1231 Encounter for screening mammogram for malignant neoplasm of breast: Secondary | ICD-10-CM | POA: Diagnosis not present

## 2022-09-07 ENCOUNTER — Other Ambulatory Visit: Payer: Self-pay | Admitting: Family Medicine

## 2022-09-07 ENCOUNTER — Ambulatory Visit (INDEPENDENT_AMBULATORY_CARE_PROVIDER_SITE_OTHER): Payer: HMO | Admitting: Family Medicine

## 2022-09-07 ENCOUNTER — Encounter: Payer: Self-pay | Admitting: Family Medicine

## 2022-09-07 VITALS — BP 142/68 | HR 92 | Temp 98.5°F | Resp 17 | Ht 60.0 in | Wt 137.8 lb

## 2022-09-07 DIAGNOSIS — E782 Mixed hyperlipidemia: Secondary | ICD-10-CM

## 2022-09-07 DIAGNOSIS — R7309 Other abnormal glucose: Secondary | ICD-10-CM

## 2022-09-07 DIAGNOSIS — I10 Essential (primary) hypertension: Secondary | ICD-10-CM

## 2022-09-07 DIAGNOSIS — E663 Overweight: Secondary | ICD-10-CM | POA: Diagnosis not present

## 2022-09-07 DIAGNOSIS — Z0000 Encounter for general adult medical examination without abnormal findings: Secondary | ICD-10-CM

## 2022-09-07 DIAGNOSIS — E559 Vitamin D deficiency, unspecified: Secondary | ICD-10-CM

## 2022-09-07 LAB — POCT GLYCOSYLATED HEMOGLOBIN (HGB A1C): Hemoglobin A1C: 5.9 % — AB (ref 4.0–5.6)

## 2022-09-07 MED ORDER — AMLODIPINE BESYLATE 10 MG PO TABS: 10.0000 mg | ORAL_TABLET | Freq: Every day | ORAL | 3 refills | Status: DC

## 2022-09-07 NOTE — Patient Instructions (Addendum)
Thank you for coming to the office today.  Increase dose Amlodipine 5mg  up to 10mg  daily, new order. You can double the 5mg  x 2 if you prefer.  Recent Labs    02/28/22 0806 09/07/22 0818  HGBA1C 5.8* 5.9*    DUE for FASTING BLOOD WORK (no food or drink after midnight before the lab appointment, only water or coffee without cream/sugar on the morning of)  SCHEDULE "Lab Only" visit in the morning at the clinic for lab draw in 6 MONTHS   - Make sure Lab Only appointment is at about 1 week before your next appointment, so that results will be available  For Lab Results, once available within 2-3 days of blood draw, you can can log in to MyChart online to view your results and a brief explanation. Also, we can discuss results at next follow-up visit.   Please schedule a Follow-up Appointment to: Return in about 6 months (around 03/09/2023) for 6 month fasting lab only then 1 week later Annual Physical.  If you have any other questions or concerns, please feel free to call the office or send a message through MyChart. You may also schedule an earlier appointment if necessary.  Additionally, you may be receiving a survey about your experience at our office within a few days to 1 week by e-mail or mail. We value your feedback.  Saralyn Pilar, DO [facility] [place]

## 2022-09-07 NOTE — Assessment & Plan Note (Signed)
Previously Well controlled lipids on statin Due for lipid next time OFF Red Yeast Rice  Plan: 1. Continue Rosuvastatin 10mg  nightly - Continue Omega 3 1000mg  daily 2. Encourage improved lifestyle - low carb/cholesterol, reduce portion size, continue improving regular exercise

## 2022-09-07 NOTE — Progress Notes (Signed)
Subjective:    Patient ID: Heidi Bell, female    DOB: 1951-02-21, 72 y.o.   MRN: [id number]  Heidi Bell is a 72 y.o. female presenting on 09/07/2022 for Hypertension (Pre diabetes )   HPI  Elevated A1c / Pre-Diabetes A1c 5.8 previously 5.5 to 5.8 She attributes improvement to lifestyle changes. No history of DM Not checking Sugar Never on medication   CHRONIC HTN: Elevated BP here in office and at home.  Last home BP reading normal range recently 130-140. This morning she was more active and attributes raised BP on that. Current Meds - HCTZ 25mg  daily, Losartan 100mg , Amlodipine 5mg  daily - OFF Potassium supplement Reports good compliance, took meds today. Tolerating well, w/o complaints. Admits occasional dizzy episode when forward bending, temporary episode. Denies CP, dyspnea, HA, edema, lightheadedness    Vitamin D Deficiency Last lab was Mild Low 28. Taking OTC Vitamin D   Chronic Anxiety Stable on Sertraline, continues on med. Admits white coat hypertension at times.   HYPERLIPIDEMIA: - Reports no concerns. Last lipid panel 03/2022, controlled LDL 50s - Currently taking Rosuvastatin 10mg  nightly, tolerating well without side effects or myalgias   Health Maintenance:  Declines vaccine.     09/07/2022    8:11 AM 03/08/2022   10:12 AM 09/05/2021    8:52 AM  Depression screen PHQ 2/9  Decreased Interest 0 0 0  Down, Depressed, Hopeless 0 1 1  PHQ - 2 Score 0 1 1  Altered sleeping 0 0 0  Tired, decreased energy 0 1 1  Change in appetite 0 0 0  Feeling bad or failure about yourself  0 0 0  Trouble concentrating 0 1 1  Moving slowly or fidgety/restless 0 0 1  Suicidal thoughts 0 0 0  PHQ-9 Score 0 3 4  Difficult doing work/chores Not difficult at all Not difficult at all Not difficult at all      09/07/2022    8:12 AM 03/08/2022   10:12 AM 09/05/2021    8:53 AM 03/06/2021    8:35 AM  GAD 7 : Generalized Anxiety Score  Nervous, Anxious, on Edge 3 1 1 1    Control/stop worrying 2 1 1 1   Worry too much - different things 2 1 1 1   Trouble relaxing 2 1 1 1   Restless 2 1 1 1   Easily annoyed or irritable 1 0 0 1  Afraid - awful might happen 0 1 1 1   Total GAD 7 Score 12 6 6 7   Anxiety Difficulty Not difficult at all Not difficult at all Not difficult at all Not difficult at all      Social History   Tobacco Use   Smoking status: Never   Smokeless tobacco: Never  Vaping Use   Vaping Use: Never used  Substance Use Topics   Alcohol use: No    Alcohol/week: 0.0 standard drinks of alcohol   Drug use: No    Review of Systems Per HPI unless specifically indicated above     Objective:    BP (!) 142/68   Pulse 92   Temp 98.5 F (36.9 C) (Oral)   Resp 17   Ht 5' (1.524 m)   Wt 137 lb 12.8 oz (62.5 kg)   SpO2 97%   BMI 26.91 kg/m   Wt Readings from Last 3 Encounters:  09/07/22 137 lb 12.8 oz (62.5 kg)  03/08/22 139 lb (63 kg)  09/05/21 141 lb 6.4 oz (64.1 kg)  Physical Exam Vitals and nursing note reviewed.  Constitutional:      General: She is not in acute distress.    Appearance: Normal appearance. She is well-developed. She is not diaphoretic.     Comments: Well-appearing, comfortable, cooperative  HENT:     Head: Normocephalic and atraumatic.  Eyes:     General:        Right eye: No discharge.        Left eye: No discharge.     Conjunctiva/sclera: Conjunctivae normal.  Cardiovascular:     Rate and Rhythm: Normal rate.  Pulmonary:     Effort: Pulmonary effort is normal.  Skin:    General: Skin is warm and dry.     Findings: No erythema or rash.  Neurological:     Mental Status: She is alert and oriented to person, place, and time.  Psychiatric:        Mood and Affect: Mood normal.        Behavior: Behavior normal.        Thought Content: Thought content normal.     Comments: Well groomed, good eye contact, normal speech and thoughts     Recent Labs    02/28/22 0806 09/07/22 0818  HGBA1C 5.8* 5.9*      I have personally reviewed the radiology report from 08/03/22 on Mammogram.  CLINICAL DATA:  Screening.   EXAM: DIGITAL SCREENING BILATERAL MAMMOGRAM WITH TOMOSYNTHESIS AND CAD   TECHNIQUE: Bilateral screening digital craniocaudal and mediolateral oblique mammograms were obtained. Bilateral screening digital breast tomosynthesis was performed. The images were evaluated with computer-aided detection.   COMPARISON:  Previous exam(s).   ACR Breast Density Category c: The breasts are heterogeneously dense, which may obscure small masses.   FINDINGS: There are no findings suspicious for malignancy.   IMPRESSION: No mammographic evidence of malignancy. A result letter of this screening mammogram will be mailed directly to the patient.   RECOMMENDATION: Screening mammogram in one year. (Code:[id number])   BI-RADS CATEGORY  1: Negative.     Electronically Signed   By: Ted Mcalpine M.D.   On: 08/06/2022 11:08  Results for orders placed or performed in visit on 09/07/22  POCT glycosylated hemoglobin (Hb A1C)  Result Value Ref Range   Hemoglobin A1C 5.9 (A) 4.0 - 5.6 %      Assessment & Plan:   Problem List Items Addressed This Visit     Elevated hemoglobin A1c - Primary    Elevated to A1c 5.9, mildly Improving diet Concern with HTN, HLD  Plan:  1. Not on any therapy currently  2. Encourage improved lifestyle - low carb, low sugar diet, reduce portion size, continue improving regular exercise      Relevant Orders   POCT glycosylated hemoglobin (Hb A1C) (Completed)   Essential hypertension    Elevated BP in office today, with anxiety/stress but improved on repeat but still elevated Home reading reviewed - normal readings No known complications   Plan:  1. Dose increase Amlodipine 5 to 10mg  daily, counsel on possible edema risk 2. Continue other meds - current BP regimen - HCTZ 25mg  daily, Losartan 100mg , Amlodipine 5mg  daily 3. Encourage improved  lifestyle - low sodium diet, regular exercise 4. Continue monitor BP outside office, bring readings to next visit, if persistently >140/90 or new symptoms notify office sooner  Follow-up 5-6 months      Relevant Medications   amLODipine (NORVASC) 10 MG tablet   Hyperlipidemia    Previously Well controlled lipids on  statin Due for lipid next time OFF Red Yeast Rice  Plan: 1. Continue Rosuvastatin 10mg  nightly - Continue Omega 3 1000mg  daily 2. Encourage improved lifestyle - low carb/cholesterol, reduce portion size, continue improving regular exercise      Relevant Medications   amLODipine (NORVASC) 10 MG tablet   Overweight (BMI 25.0-29.9)      Meds ordered this encounter  Medications   amLODipine (NORVASC) 10 MG tablet    Sig: Take 1 tablet (10 mg total) by mouth daily.    Dispense:  90 tablet    Refill:  3    Dose increase from 5 to 10mg      Follow up plan: Return in about 6 months (around 03/09/2023) for 6 month fasting lab only then 1 week later Annual Physical.  Future labs ordered for 03/2023   Saralyn Pilar, DO [facility] 09/07/2022, 8:13 AM

## 2022-09-07 NOTE — Assessment & Plan Note (Signed)
Elevated to A1c 5.9, mildly Improving diet Concern with HTN, HLD  Plan:  1. Not on any therapy currently  2. Encourage improved lifestyle - low carb, low sugar diet, reduce portion size, continue improving regular exercise

## 2022-09-07 NOTE — Assessment & Plan Note (Signed)
Elevated BP in office today, with anxiety/stress but improved on repeat but still elevated Home reading reviewed - normal readings No known complications   Plan:  1. Dose increase Amlodipine 5 to 10mg  daily, counsel on possible edema risk 2. Continue other meds - current BP regimen - HCTZ 25mg  daily, Losartan 100mg , Amlodipine 5mg  daily 3. Encourage improved lifestyle - low sodium diet, regular exercise 4. Continue monitor BP outside office, bring readings to next visit, if persistently >140/90 or new symptoms notify office sooner  Follow-up 5-6 months

## 2022-09-27 ENCOUNTER — Ambulatory Visit (INDEPENDENT_AMBULATORY_CARE_PROVIDER_SITE_OTHER): Payer: HMO

## 2022-09-27 VITALS — BP 132/68 | Ht 62.0 in | Wt 139.4 lb

## 2022-09-27 DIAGNOSIS — Z0000 Encounter for general adult medical examination without abnormal findings: Secondary | ICD-10-CM | POA: Diagnosis not present

## 2022-09-27 NOTE — Patient Instructions (Signed)
Heidi Bell , Thank you for taking time to come for your Medicare Wellness Visit. I appreciate your ongoing commitment to your health goals. Please review the following plan we discussed and let me know if I can assist you in the future.   These are the goals we discussed:  Goals       DIET - EAT MORE FRUITS AND VEGETABLES      DIET - INCREASE WATER INTAKE      Recommend drinking at least 6-8 glasses of water a day       RNCM: [name]: "[name]" (pt-stated)      CARE PLAN ENTRY (see longtitudinal plan of care for additional care plan information)  Current Barriers:  Chronic Disease Management support, education, and care coordination needs related to HTN, HLD, and Anxiety  Clinical Goal(s) related to HTN, HLD, and Anxiety:  Over the next 120 days, patient will:  Work with the care management team to address educational, disease management, and care coordination needs  Begin or continue self health monitoring activities as directed today Measure and record blood pressure 4 times per week and adhere to a heart healthy diet, increase activity as tolerated Call provider office for new or worsened signs and symptoms Blood pressure findings outside established parameters and New or worsened symptom related to HLD and anxiety Call care management team with questions or concerns Verbalize basic understanding of patient centered plan of care established today  Interventions related to HTN, HLD, and Anxiety:  Evaluation of current treatment plans and patient's adherence to plan as established by provider. The patient called today and she was able to review all her meds with the [facility] and tell the RNCM what they were for Assessed patient understanding of disease states: Sisters are knowledgeable about their sisters condition. RNCM will reach out to the patient on 3/29 to evaluate patient. The patient answered questions appropriately. The patient feels she  does a good job at taking care of herself. Able to tell RNCM her routine and upcoming appointments.  Assessed patient's education and care coordination needs: No current needs noted at this time. The patients sister Heidi Bell aware of resources to help the patient met her health and wellness needs  Provided disease specific education to patient: Education on taking blood pressure on a consistent basis and recording readings. Will discuss this with the patient on outreach. The patient is taking consistently. Last reading was 10/78 on Saturday 3/27. Evaluation on medication compliance. The sisters are unsure if the patient is taking medications as directed. The patient does have a pill container but some things she has mentioned have lead them to question the validity of her self care habits such as medication administration and checking blood pressures. The patient reviewed all her medications with the [facility] today. The patient states she puts her medications in a container each day for morning and night time. Able to consistently answer questions appropriately concerning medications and how she takes.  Collaborated with appropriate clinical care team members regarding patient needs.  The patients sisters know about CCM team support of pharmacist and LCSW.  No needs identified at this time.   Patient Self Care Activities related to HTN, HLD, and Anxiety:  Patient is unable to independently self-manage chronic health conditions  Please see past updates related to this goal by clicking on the "Past Updates" button in the selected goal          This  is a list of the screening recommended for you and due dates:  Health Maintenance  Topic Date Due   COVID-19 Vaccine (1) Never done   Pneumonia Vaccine (1 of 1 - PCV) 03/09/2023*   Zoster (Shingles) Vaccine (1 of 2) 06/05/2023*   Flu Shot  11/01/2022   Medicare Annual Wellness Visit  09/27/2023   Mammogram  08/02/2024   Colon Cancer Screening  04/06/2028    DEXA scan (bone density measurement)  Completed   Hepatitis C Screening  Completed   HPV Vaccine  Aged Out   DTaP/Tdap/Td vaccine  Discontinued  *Topic was postponed. The date shown is not the original due date.    Advanced directives: no  Conditions/risks identified: no  Next appointment: Follow up in one year for your annual wellness visit 10/03/23 @ 1:30 pm in person   Preventive Care 65 Years and Older, Female Preventive care refers to lifestyle choices and visits with your health care provider that can promote health and wellness. What does preventive care include? A yearly physical exam. This is also called an annual well check. Dental exams once or twice a year. Routine eye exams. Ask your health care provider how often you should have your eyes checked. Personal lifestyle choices, including: Daily care of your teeth and gums. Regular physical activity. Eating a healthy diet. Avoiding tobacco and drug use. Limiting alcohol use. Practicing safe sex. Taking low-dose aspirin every day. Taking vitamin and mineral supplements as recommended by your health care provider. What happens during an annual well check? The services and screenings done by your health care provider during your annual well check will depend on your age, overall health, lifestyle risk factors, and family history of disease. Counseling  Your health care provider may ask you questions about your: Alcohol use. Tobacco use. Drug use. Emotional well-being. Home and relationship well-being. Sexual activity. Eating habits. History of falls. Memory and ability to understand (cognition). Work and work Astronomer. Reproductive health. Screening  You may have the following tests or measurements: Height, weight, and BMI. Blood pressure. Lipid and cholesterol levels. These may be checked every 5 years, or more frequently if you are over 41 years old. Skin check. Lung cancer screening. You may have this  screening every year starting at age 72 if you have a 30-pack-year history of smoking and currently smoke or have quit within the past 15 years. Fecal occult blood test (FOBT) of the stool. You may have this test every year starting at age 31. Flexible sigmoidoscopy or colonoscopy. You may have a sigmoidoscopy every 5 years or a colonoscopy every 10 years starting at age 42. Hepatitis C blood test. Hepatitis B blood test. Sexually transmitted disease (STD) testing. Diabetes screening. This is done by checking your blood sugar (glucose) after you have not eaten for a while (fasting). You may have this done every 1-3 years. Bone density scan. This is done to screen for osteoporosis. You may have this done starting at age 58. Mammogram. This may be done every 1-2 years. Talk to your health care provider about how often you should have regular mammograms. Talk with your health care provider about your test results, treatment options, and if necessary, the need for more tests. Vaccines  Your health care provider may recommend certain vaccines, such as: Influenza vaccine. This is recommended every year. Tetanus, diphtheria, and acellular pertussis (Tdap, Td) vaccine. You may need a Td booster every 10 years. Zoster vaccine. You may need this after age 61. Pneumococcal 13-valent  conjugate (PCV13) vaccine. One dose is recommended after age 34. Pneumococcal polysaccharide (PPSV23) vaccine. One dose is recommended after age 31. Talk to your health care provider about which screenings and vaccines you need and how often you need them. This information is not intended to replace advice given to you by your health care provider. Make sure you discuss any questions you have with your health care provider. Document Released: 04/15/2015 Document Revised: 12/07/2015 Document Reviewed: 01/18/2015 Elsevier Interactive Patient Education  2017 [employer].  Fall Prevention in the Home Falls can cause injuries.  They can happen to people of all ages. There are many things you can do to make your home safe and to help prevent falls. What can I do on the outside of my home? Regularly fix the edges of walkways and driveways and fix any cracks. Remove anything that might make you trip as you walk through a door, such as a raised step or threshold. Trim any bushes or trees on the path to your home. Use bright outdoor lighting. Clear any walking paths of anything that might make someone trip, such as rocks or tools. Regularly check to see if handrails are loose or broken. Make sure that both sides of any steps have handrails. Any raised decks and porches should have guardrails on the edges. Have any leaves, snow, or ice cleared regularly. Use sand or salt on walking paths during winter. Clean up any spills in your garage right away. This includes oil or grease spills. What can I do in the bathroom? Use night lights. Install grab bars by the toilet and in the tub and shower. Do not use towel bars as grab bars. Use non-skid mats or decals in the tub or shower. If you need to sit down in the shower, use a plastic, non-slip stool. Keep the floor dry. Clean up any water that spills on the floor as soon as it happens. Remove soap buildup in the tub or shower regularly. Attach bath mats securely with double-sided non-slip rug tape. Do not have throw rugs and other things on the floor that can make you trip. What can I do in the bedroom? Use night lights. Make sure that you have a light by your bed that is easy to reach. Do not use any sheets or blankets that are too big for your bed. They should not hang down onto the floor. Have a firm chair that has side arms. You can use this for support while you get dressed. Do not have throw rugs and other things on the floor that can make you trip. What can I do in the kitchen? Clean up any spills right away. Avoid walking on wet floors. Keep items that you use a lot  in easy-to-reach places. If you need to reach something above you, use a strong step stool that has a grab bar. Keep electrical cords out of the way. Do not use floor polish or wax that makes floors slippery. If you must use wax, use non-skid floor wax. Do not have throw rugs and other things on the floor that can make you trip. What can I do with my stairs? Do not leave any items on the stairs. Make sure that there are handrails on both sides of the stairs and use them. Fix handrails that are broken or loose. Make sure that handrails are as long as the stairways. Check any carpeting to make sure that it is firmly attached to the stairs. Fix any carpet that  is loose or worn. Avoid having throw rugs at the top or bottom of the stairs. If you do have throw rugs, attach them to the floor with carpet tape. Make sure that you have a light switch at the top of the stairs and the bottom of the stairs. If you do not have them, ask someone to add them for you. What else can I do to help prevent falls? Wear shoes that: Do not have high heels. Have rubber bottoms. Are comfortable and fit you well. Are closed at the toe. Do not wear sandals. If you use a stepladder: Make sure that it is fully opened. Do not climb a closed stepladder. Make sure that both sides of the stepladder are locked into place. Ask someone to hold it for you, if possible. Clearly mark and make sure that you can see: Any grab bars or handrails. First and last steps. Where the edge of each step is. Use tools that help you move around (mobility aids) if they are needed. These include: Canes. Walkers. Scooters. Crutches. Turn on the lights when you go into a dark area. Replace any light bulbs as soon as they burn out. Set up your furniture so you have a clear path. Avoid moving your furniture around. If any of your floors are uneven, fix them. If there are any pets around you, be aware of where they are. Review your medicines  with your doctor. Some medicines can make you feel dizzy. This can increase your chance of falling. Ask your doctor what other things that you can do to help prevent falls. This information is not intended to replace advice given to you by your health care provider. Make sure you discuss any questions you have with your health care provider. Document Released: 01/13/2009 Document Revised: 08/25/2015 Document Reviewed: 04/23/2014 Elsevier Interactive Patient Education  2017 [employer].

## 2022-09-27 NOTE — Progress Notes (Signed)
Subjective:   Heidi Bell is a 72 y.o. female who presents for Medicare Annual (Subsequent) preventive examination.  Visit Complete: In person   Review of Systems     Cardiac Risk Factors include: advanced age (>24men, >60 women);dyslipidemia;hypertension     Objective:    Today's Vitals   09/27/22 1334 09/27/22 1349  BP:  132/68  Weight:  139 lb 6.4 oz (63.2 kg)  Height:  5\' 2"  (1.575 m)  PainSc: 8     Body mass index is 25.5 kg/m.     09/27/2022    1:41 PM 04/06/2021    8:14 AM 09/02/2018   11:23 AM 07/30/2017    2:25 PM 01/12/2015    7:43 AM  Advanced Directives  Does Patient Have a Medical Advance Directive? No Yes Yes Yes No  Type of Advance Directive  Living will Living will;Healthcare Power of Attorney Living will;Healthcare Power of Attorney   Does patient want to make changes to medical advance directive?  No - Guardian declined     Copy of Healthcare Power of Attorney in Chart?   Yes - validated most recent copy scanned in chart (See row information) No - copy requested   Would patient like information on creating a medical advance directive? No - Patient declined    No - patient declined information    Current Medications (verified) Outpatient Encounter Medications as of 09/27/2022  Medication Sig   amLODipine (NORVASC) 10 MG tablet Take 1 tablet (10 mg total) by mouth daily.   Ascorbic Acid (VITAMIN C) 500 MG CAPS Take 500 mg by mouth daily. (Patient taking differently: Take 1,000 mg by mouth daily.)   Calcium Carb-Cholecalciferol 600-800 MG-UNIT TABS Take by mouth.   Garlic 1000 MG CAPS Take by mouth 2 (two) times daily.    glucosamine-chondroitin 500-400 MG tablet Take 1 tablet by mouth 2 (two) times daily. (Patient taking differently: Take 1 tablet by mouth 2 (two) times daily.)   hydrochlorothiazide (HYDRODIURIL) 25 MG tablet Take 1 tablet (25 mg total) by mouth daily.   ketoconazole (NIZORAL) 2 % cream Apply to the feet QHS.   losartan (COZAAR) 100  MG tablet Take 1 tablet (100 mg total) by mouth daily.   Multiple Vitamins-Minerals (MULTIVITAMIN WITH MINERALS) tablet Take 1 tablet by mouth daily.   pyridOXINE (VITAMIN B-6) 100 MG tablet Take 100 mg by mouth daily.   rosuvastatin (CRESTOR) 10 MG tablet Take 1 tablet (10 mg total) by mouth at bedtime.   sertraline (ZOLOFT) 50 MG tablet Take 1 tablet (50 mg total) by mouth daily.   No facility-administered encounter medications on file as of 09/27/2022.    Allergies (verified) Penicillins   History: Past Medical History:  Diagnosis Date   Anxiety    Atypical mole 06/16/2018   L mid lat back near braline/mod   Basal cell carcinoma 04/22/2018   L cheek/excision   Hyperlipidemia    Hypertension    Pre-diabetes    Past Surgical History:  Procedure Laterality Date   ABDOMINAL HYSTERECTOMY     boil removed  1984   BREAST CYST ASPIRATION Left 1994   Dr. Lemar Livings   BREAST CYST ASPIRATION Left 10/2017   COLONOSCOPY WITH PROPOFOL N/A 01/12/2015   Procedure: COLONOSCOPY WITH PROPOFOL;  Surgeon: Kieth Brightly, MD;  Location: [facility];  Service: Endoscopy;  Laterality: N/A;   COLONOSCOPY WITH PROPOFOL N/A 04/06/2021   Procedure: COLONOSCOPY WITH PROPOFOL;  Surgeon: Midge Minium, MD;  Location: [facility];  Service:  Endoscopy;  Laterality: N/A;   POLYPECTOMY  04/06/2021   Procedure: POLYPECTOMY;  Surgeon: Midge Minium, MD;  Location: [facility];  Service: Endoscopy;;   SKIN CANCER EXCISION     left shoulder, mid back and left cheek.    Family History  Problem Relation Age of Onset   Colon cancer Father    COPD Mother    Lung cancer Mother    Bleeding Disorder Mother    Breast cancer Sister    Social History   Socioeconomic History   Marital status: Single    Spouse name: Not on file   Number of children: 0   Years of education: Not on file   Highest education level: High school graduate  Occupational History   Occupation: retired  Tobacco Use    Smoking status: Never   Smokeless tobacco: Never  Vaping Use   Vaping Use: Never used  Substance and Sexual Activity   Alcohol use: No    Alcohol/week: 0.0 standard drinks of alcohol   Drug use: No   Sexual activity: Not on file  Other Topics Concern   Not on file  Social History Narrative   Not on file   Social Determinants of Health   Financial Resource Strain: Low Risk  (09/27/2022)   Overall Financial Resource Strain (CARDIA)    Difficulty of Paying Living Expenses: Not hard at all  Food Insecurity: No Food Insecurity (09/27/2022)   Hunger Vital Sign    Worried About Running Out of Food in the Last Year: Never true    Ran Out of Food in the Last Year: Never true  Transportation Needs: No Transportation Needs (09/27/2022)   PRAPARE - Administrator, Civil Service (Medical): No    Lack of Transportation (Non-Medical): No  Physical Activity: Insufficiently Active (09/27/2022)   Exercise Vital Sign    Days of Exercise per Week: 3 days    Minutes of Exercise per Session: 30 min  Stress: No Stress Concern Present (09/27/2022)   [employer] of Occupational Health - Occupational Stress Questionnaire    Feeling of Stress : Not at all  Social Connections: Socially Isolated (09/27/2022)   Social Connection and Isolation Panel [NHANES]    Frequency of Communication with Friends and Family: Once a week    Frequency of Social Gatherings with Friends and Family: More than three times a week    Attends Religious Services: Never    Database administrator or Organizations: No    Attends Engineer, structural: Never    Marital Status: Never married    Tobacco Counseling Counseling given: Not Answered   Clinical Intake:  Pre-visit preparation completed: Yes  Pain : 0-10 Pain Score: 8  Pain Type: Chronic pain Pain Location: Hand Pain Orientation: Right, Left     Nutritional Risks: None Diabetes: No  How often do you need to have someone help you when  you read instructions, pamphlets, or other written materials from your doctor or pharmacy?: 1 - Never  Interpreter Needed?: No  Information entered by :: Kennedy Bucker, LPN   Activities of Daily Living    09/27/2022    1:35 PM 09/07/2022    8:12 AM  In your present state of health, do you have any difficulty performing the following activities:  Hearing? 0 0  Vision? 0 0  Difficulty concentrating or making decisions? 1 1  Walking or climbing stairs? 0 0  Dressing or bathing? 0 0  Doing errands, shopping? 0  0  Preparing Food and eating ? N   Using the Toilet? N   In the past six months, have you accidently leaked urine? N   Do you have problems with loss of bowel control? N   Managing your Medications? N   Managing your Finances? N   Housekeeping or managing your Housekeeping? N     Patient Care Team: Smitty Cords, DO as PCP - General (Family Medicine) Lemar Livings Merrily Pew, MD as Consulting Physician (General Surgery) Jaclyn Shaggy, MD as Referring Physician (Internal Medicine)  Indicate any recent Medical Services you may have received from other than [facility] providers in the past year (date may be approximate).     Assessment:   This is a routine wellness examination for Gabriellah.  Hearing/Vision screen Hearing Screening - Comments:: No aids Vision Screening - Comments:: Readers- Dr.Woodard  Dietary issues and exercise activities discussed:     Goals Addressed             This Visit's Progress    DIET - EAT MORE FRUITS AND VEGETABLES         Depression Screen    09/27/2022    1:39 PM 09/07/2022    8:11 AM 03/08/2022   10:12 AM 09/05/2021    8:52 AM 07/27/2021    2:41 PM 03/06/2021    8:35 AM 02/29/2020    8:16 AM  PHQ 2/9 Scores  PHQ - 2 Score 0 0 1 1 0 0 0  PHQ- 9 Score 0 0 3 4  0     Fall Risk    09/27/2022    1:41 PM 09/07/2022    8:12 AM 09/05/2021    8:52 AM 07/27/2021    2:45 PM 03/06/2021    8:35 AM  Fall Risk   Falls in the past year? 0 0 0  1 0  Number falls in past yr: 0 0 0 0 0  Injury with Fall? 0 0 0 0 0  Risk for fall due to : No Fall Risks No Fall Risks No Fall Risks No Fall Risks   Follow up Falls prevention discussed;Falls evaluation completed Falls evaluation completed Falls evaluation completed Falls evaluation completed Falls evaluation completed    MEDICARE RISK AT HOME:  Medicare Risk at Home - 09/27/22 1341     Any stairs in or around the home? Yes    If so, are there any without handrails? No    Home free of loose throw rugs in walkways, pet beds, electrical cords, etc? Yes    Adequate lighting in your home to reduce risk of falls? Yes    Life alert? No    Use of a cane, walker or w/c? No    Grab bars in the bathroom? Yes    Shower chair or bench in shower? No    Elevated toilet seat or a handicapped toilet? No             TIMED UP AND GO:  Was the test performed?  Yes  Length of time to ambulate 10 feet: 4 sec Gait steady and fast without use of assistive device    Cognitive Function:    09/02/2018   11:36 AM  MMSE - Mini Mental State Exam  Orientation to time 5  Orientation to Place 5  Registration 3  Attention/ Calculation 5  Recall 2  Language- name 2 objects 2  Language- repeat 1  Language- follow 3 step command 3  Language- read & follow  direction 1  Write a sentence 1  Copy design 1  Total score 29        09/27/2022    1:44 PM 07/27/2021    2:50 PM 07/30/2017    2:25 PM  6CIT Screen  What Year? 0 points 0 points 0 points  What month? 0 points 0 points 0 points  What time? 0 points 0 points 0 points  Count back from 20 0 points 0 points 0 points  Months in reverse 0 points 0 points 0 points  Repeat phrase 4 points 2 points 2 points  Total Score 4 points 2 points 2 points    Immunizations Immunization History  Administered Date(s) Administered   Influenza, High Dose Seasonal PF 02/18/2018    TDAP status: Due, Education has been provided regarding the importance of  this vaccine. Advised may receive this vaccine at local pharmacy or Health Dept. Aware to provide a copy of the vaccination record if obtained from local pharmacy or Health Dept. Verbalized acceptance and understanding.  Flu Vaccine status: Declined, Education has been provided regarding the importance of this vaccine but patient still declined. Advised may receive this vaccine at local pharmacy or Health Dept. Aware to provide a copy of the vaccination record if obtained from local pharmacy or Health Dept. Verbalized acceptance and understanding.  Pneumococcal vaccine status: Declined,  Education has been provided regarding the importance of this vaccine but patient still declined. Advised may receive this vaccine at local pharmacy or Health Dept. Aware to provide a copy of the vaccination record if obtained from local pharmacy or Health Dept. Verbalized acceptance and understanding.   Covid-19 vaccine status: Declined, Education has been provided regarding the importance of this vaccine but patient still declined. Advised may receive this vaccine at local pharmacy or Health Dept.or vaccine clinic. Aware to provide a copy of the vaccination record if obtained from local pharmacy or Health Dept. Verbalized acceptance and understanding.  Qualifies for Shingles Vaccine? Yes   Zostavax completed No   Shingrix Completed?: No.    Education has been provided regarding the importance of this vaccine. Patient has been advised to call insurance company to determine out of pocket expense if they have not yet received this vaccine. Advised may also receive vaccine at local pharmacy or Health Dept. Verbalized acceptance and understanding.  Screening Tests Health Maintenance  Topic Date Due   COVID-19 Vaccine (1) Never done   Pneumonia Vaccine 63+ Years old (1 of 1 - PCV) 03/09/2023 (Originally 05/05/2013)   Zoster Vaccines- Shingrix (1 of 2) 06/05/2023 (Originally 05/06/1969)   INFLUENZA VACCINE  11/01/2022    Medicare Annual Wellness (AWV)  09/27/2023   MAMMOGRAM  08/02/2024   Colonoscopy  04/06/2028   DEXA SCAN  Completed   Hepatitis C Screening  Completed   HPV VACCINES  Aged Out   DTaP/Tdap/Td  Discontinued    Health Maintenance  Health Maintenance Due  Topic Date Due   COVID-19 Vaccine (1) Never done    Colorectal cancer screening: Type of screening: Colonoscopy. Completed 04/06/21. Repeat every 7 years  Mammogram status: Completed 08/03/22. Repeat every year  Bone Density status: Completed 10/30/17. Results reflect: Bone density results: OSTEOPENIA. Repeat every 5 years.  Lung Cancer Screening: (Low Dose CT Chest recommended if Age 54-80 years, 20 pack-year currently smoking OR have quit w/in 15years.) does not qualify.   Additional Screening:  Hepatitis C Screening: does qualify; Completed 02/12/18  Vision Screening: Recommended annual ophthalmology exams for early detection of glaucoma  and other disorders of the eye. Is the patient up to date with their annual eye exam?  Yes  Who is the provider or what is the name of the office in which the patient attends annual eye exams? Dr. Clydene Pugh  If pt is not established with a provider, would they like to be referred to a provider to establish care? No .   Dental Screening: Recommended annual dental exams for proper oral hygiene   Community Resource Referral / Chronic Care Management: CRR required this visit?  Yes   CCM required this visit?  No     Plan:     I have personally reviewed and noted the following in the patient's chart:   Medical and social history Use of alcohol, tobacco or illicit drugs  Current medications and supplements including opioid prescriptions. Patient is not currently taking opioid prescriptions. Functional ability and status Nutritional status Physical activity Advanced directives List of other physicians Hospitalizations, surgeries, and ER visits in previous 12 months Vitals Screenings to  include cognitive, depression, and falls Referrals and appointments  In addition, I have reviewed and discussed with patient certain preventive protocols, quality metrics, and best practice recommendations. A written personalized care plan for preventive services as well as general preventive health recommendations were provided to patient.     Hal Hope, LPN   [id number]   After Visit Summary: (MyChart) Due to this being a telephonic visit, the after visit summary with patients personalized plan was offered to patient via MyChart   Nurse Notes: none

## 2022-10-18 ENCOUNTER — Ambulatory Visit: Payer: PPO | Admitting: Dermatology

## 2022-11-19 ENCOUNTER — Ambulatory Visit (INDEPENDENT_AMBULATORY_CARE_PROVIDER_SITE_OTHER): Payer: HMO | Admitting: Dermatology

## 2022-11-19 ENCOUNTER — Encounter: Payer: Self-pay | Admitting: Dermatology

## 2022-11-19 DIAGNOSIS — W908XXA Exposure to other nonionizing radiation, initial encounter: Secondary | ICD-10-CM

## 2022-11-19 DIAGNOSIS — B353 Tinea pedis: Secondary | ICD-10-CM

## 2022-11-19 DIAGNOSIS — Z86018 Personal history of other benign neoplasm: Secondary | ICD-10-CM

## 2022-11-19 DIAGNOSIS — D229 Melanocytic nevi, unspecified: Secondary | ICD-10-CM

## 2022-11-19 DIAGNOSIS — Z7189 Other specified counseling: Secondary | ICD-10-CM

## 2022-11-19 DIAGNOSIS — L570 Actinic keratosis: Secondary | ICD-10-CM

## 2022-11-19 DIAGNOSIS — L578 Other skin changes due to chronic exposure to nonionizing radiation: Secondary | ICD-10-CM | POA: Diagnosis not present

## 2022-11-19 DIAGNOSIS — Z1283 Encounter for screening for malignant neoplasm of skin: Secondary | ICD-10-CM

## 2022-11-19 DIAGNOSIS — Z85828 Personal history of other malignant neoplasm of skin: Secondary | ICD-10-CM

## 2022-11-19 DIAGNOSIS — Z79899 Other long term (current) drug therapy: Secondary | ICD-10-CM

## 2022-11-19 DIAGNOSIS — L988 Other specified disorders of the skin and subcutaneous tissue: Secondary | ICD-10-CM

## 2022-11-19 DIAGNOSIS — L821 Other seborrheic keratosis: Secondary | ICD-10-CM

## 2022-11-19 DIAGNOSIS — D1801 Hemangioma of skin and subcutaneous tissue: Secondary | ICD-10-CM | POA: Diagnosis not present

## 2022-11-19 DIAGNOSIS — R238 Other skin changes: Secondary | ICD-10-CM

## 2022-11-19 DIAGNOSIS — L814 Other melanin hyperpigmentation: Secondary | ICD-10-CM

## 2022-11-19 MED ORDER — KETOCONAZOLE 2 % EX CREA
TOPICAL_CREAM | CUTANEOUS | 11 refills | Status: DC
Start: 2022-11-19 — End: 2023-11-18

## 2022-11-19 NOTE — Patient Instructions (Signed)

## 2022-11-19 NOTE — Progress Notes (Signed)
Follow-Up Visit   Subjective  Heidi Bell is a 72 y.o. female who presents for the following: Skin Cancer Screening and Full Body Skin Exam  Patient with hx of BCC and dysplastic nevi.   Patient does use ketoconazole cream for tinea pedis.   The patient presents for Total-Body Skin Exam (TBSE) for skin cancer screening and mole check. The patient has spots, moles and lesions to be evaluated, some may be new or changing and the patient may have concern these could be cancer.    The following portions of the chart were reviewed this encounter and updated as appropriate: medications, allergies, medical history  Review of Systems:  No other skin or systemic complaints except as noted in HPI or Assessment and Plan.  Objective  Well appearing patient in no apparent distress; mood and affect are within normal limits.  A full examination was performed including scalp, head, eyes, ears, nose, lips, neck, chest, axillae, abdomen, back, buttocks, bilateral upper extremities, bilateral lower extremities, hands, feet, fingers, toes, fingernails, and toenails. All findings within normal limits unless otherwise noted below.   Relevant physical exam findings are noted in the Assessment and Plan.  right medial cheek x 1 Erythematous thin papules/macules with gritty scale.   left nasal bridge Dilated blood vessels    Assessment & Plan   SKIN CANCER SCREENING PERFORMED TODAY.  ACTINIC DAMAGE - Chronic condition, secondary to cumulative UV/sun exposure - diffuse scaly erythematous macules with underlying dyspigmentation - Recommend daily broad spectrum sunscreen SPF 30+ to sun-exposed areas, reapply every 2 hours as needed.  - Staying in the shade or wearing long sleeves, sun glasses (UVA+UVB protection) and wide brim hats (4-inch brim around the entire circumference of the hat) are also recommended for sun protection.  - Call for new or changing lesions.  LENTIGINES, SEBORRHEIC  KERATOSES, HEMANGIOMAS - Benign normal skin lesions - Benign-appearing - Call for any changes  MELANOCYTIC NEVI - Tan-brown and/or pink-flesh-colored symmetric macules and papules - Benign appearing on exam today - Observation - Call clinic for new or changing moles - Recommend daily use of broad spectrum spf 30+ sunscreen to sun-exposed areas.   HISTORY OF BASAL CELL CARCINOMA OF THE SKIN - No evidence of recurrence today at left cheek, 2020 - Recommend regular full body skin exams - Recommend daily broad spectrum sunscreen SPF 30+ to sun-exposed areas, reapply every 2 hours as needed.  - Call if any new or changing lesions are noted between office visits  History of Dysplastic Nevi - No evidence of recurrence today - Recommend regular full body skin exams - Recommend daily broad spectrum sunscreen SPF 30+ to sun-exposed areas, reapply every 2 hours as needed.  - Call if any new or changing lesions are noted between office visits  TINEA PEDIS Exam: Scaling and maceration web spaces and over distal and lateral soles. Chronic and persistent condition with duration or expected duration over one year. Condition is symptomatic / bothersome to patient. Not to goal.  Treatment Plan: Continue ketoconazole 2% cream to feet and in between toes daily.     AK (actinic keratosis) right medial cheek x 1  Actinic keratoses are precancerous spots that appear secondary to cumulative UV radiation exposure/sun exposure over time. They are chronic with expected duration over 1 year. A portion of actinic keratoses will progress to squamous cell carcinoma of the skin. It is not possible to reliably predict which spots will progress to skin cancer and so treatment is recommended to  prevent development of skin cancer.  Recommend daily broad spectrum sunscreen SPF 30+ to sun-exposed areas, reapply every 2 hours as needed.  Recommend staying in the shade or wearing long sleeves, sun glasses (UVA+UVB  protection) and wide brim hats (4-inch brim around the entire circumference of the hat). Call for new or changing lesions.   Destruction of lesion - right medial cheek x 1  Destruction method: cryotherapy   Informed consent: discussed and consent obtained   Lesion destroyed using liquid nitrogen: Yes   Cryotherapy cycles:  2 Outcome: patient tolerated procedure well with no complications   Post-procedure details: wound care instructions given    Venous lake left nasal bridge  /Telangiectasia   Benign-appearing.  Observation.  Call clinic for new or changing lesions.  Recommend daily use of broad spectrum spf 30+ sunscreen to sun-exposed areas.    Tinea pedis of both feet  Related Medications ketoconazole (NIZORAL) 2 % cream Apply to the feet QHS.   Return in about 1 year (around 11/19/2023) for Hx BCC, Hx Dysplastic Nevi, Hx AK.  Anise Salvo, RMA, am acting as scribe for Armida Sans, MD .   Documentation: I have reviewed the above documentation for accuracy and completeness, and I agree with the above.  Armida Sans, MD

## 2022-11-26 ENCOUNTER — Encounter: Payer: Self-pay | Admitting: Dermatology

## 2023-01-16 ENCOUNTER — Other Ambulatory Visit: Payer: Self-pay

## 2023-01-16 DIAGNOSIS — F419 Anxiety disorder, unspecified: Secondary | ICD-10-CM

## 2023-01-16 DIAGNOSIS — I10 Essential (primary) hypertension: Secondary | ICD-10-CM

## 2023-01-16 MED ORDER — LOSARTAN POTASSIUM 100 MG PO TABS: 100.0000 mg | ORAL_TABLET | Freq: Every day | ORAL | 3 refills | Status: DC

## 2023-01-16 MED ORDER — SERTRALINE HCL 50 MG PO TABS: 50.0000 mg | ORAL_TABLET | Freq: Every day | ORAL | 3 refills | Status: DC

## 2023-01-16 MED ORDER — HYDROCHLOROTHIAZIDE 25 MG PO TABS: 25.0000 mg | ORAL_TABLET | Freq: Every day | ORAL | 3 refills | Status: DC

## 2023-03-04 ENCOUNTER — Other Ambulatory Visit: Payer: HMO

## 2023-03-04 DIAGNOSIS — E559 Vitamin D deficiency, unspecified: Secondary | ICD-10-CM

## 2023-03-04 DIAGNOSIS — Z0000 Encounter for general adult medical examination without abnormal findings: Secondary | ICD-10-CM

## 2023-03-04 DIAGNOSIS — R7309 Other abnormal glucose: Secondary | ICD-10-CM | POA: Diagnosis not present

## 2023-03-04 DIAGNOSIS — E782 Mixed hyperlipidemia: Secondary | ICD-10-CM | POA: Diagnosis not present

## 2023-03-04 DIAGNOSIS — I10 Essential (primary) hypertension: Secondary | ICD-10-CM

## 2023-03-04 DIAGNOSIS — E663 Overweight: Secondary | ICD-10-CM | POA: Diagnosis not present

## 2023-03-05 LAB — LIPID PANEL
Cholesterol: 165 mg/dL (ref <200)
HDL: 74 mg/dL (ref >=50)
LDL Cholesterol (Calc): 70 mg/dL
Non-HDL Cholesterol (Calc): 91 mg/dL (ref <130)
Total CHOL/HDL Ratio: 2.2 (calc) (ref <5.0)
Triglycerides: 133 mg/dL (ref <150)

## 2023-03-05 LAB — CBC WITH DIFFERENTIAL/PLATELET
Absolute Lymphocytes: 2182 {cells}/uL (ref 850–3900)
Absolute Monocytes: 766 {cells}/uL (ref 200–950)
Basophils Absolute: 62 {cells}/uL (ref 0–200)
Basophils Relative: 0.7 %
Eosinophils Absolute: 141 {cells}/uL (ref 15–500)
Eosinophils Relative: 1.6 %
HCT: 39.1 % (ref 35.0–45.0)
Hemoglobin: 12.8 g/dL (ref 11.7–15.5)
MCH: 29.4 pg (ref 27.0–33.0)
MCHC: 32.7 g/dL (ref 32.0–36.0)
MCV: 89.7 fL (ref 80.0–100.0)
MPV: 11.5 fL (ref 7.5–12.5)
Monocytes Relative: 8.7 %
Neutro Abs: 5650 {cells}/uL (ref 1500–7800)
Neutrophils Relative %: 64.2 %
Platelets: 240 10*3/uL (ref 140–400)
RBC: 4.36 10*6/uL (ref 3.80–5.10)
RDW: 11.7 % (ref 11.0–15.0)
Total Lymphocyte: 24.8 %
WBC: 8.8 10*3/uL (ref 3.8–10.8)

## 2023-03-05 LAB — COMPLETE METABOLIC PANEL WITH GFR
AG Ratio: 1.9 (calc) (ref 1.0–2.5)
ALT: 14 U/L (ref 6–29)
AST: 22 U/L (ref 10–35)
Albumin: 5.1 g/dL (ref 3.6–5.1)
Alkaline phosphatase (APISO): 89 U/L (ref 37–153)
BUN: 18 mg/dL (ref 7–25)
CO2: 34 mmol/L — ABNORMAL HIGH (ref 20–32)
Calcium: 10.3 mg/dL (ref 8.6–10.4)
Chloride: 100 mmol/L (ref 98–110)
Creat: 0.78 mg/dL (ref 0.60–1.00)
Globulin: 2.7 g/dL (ref 1.9–3.7)
Glucose, Bld: 113 mg/dL — ABNORMAL HIGH (ref 65–99)
Potassium: 3.9 mmol/L (ref 3.5–5.3)
Sodium: 142 mmol/L (ref 135–146)
Total Bilirubin: 0.9 mg/dL (ref 0.2–1.2)
Total Protein: 7.8 g/dL (ref 6.1–8.1)
eGFR: 80 mL/min/{1.73_m2} (ref >=60)

## 2023-03-05 LAB — VITAMIN D 25 HYDROXY (VIT D DEFICIENCY, FRACTURES): Vit D, 25-Hydroxy: 59 ng/mL (ref 30–100)

## 2023-03-05 LAB — TSH: TSH: 2.11 m[IU]/L (ref 0.40–4.50)

## 2023-03-05 LAB — HEMOGLOBIN A1C
Hgb A1c MFr Bld: 5.9 %{Hb} — ABNORMAL HIGH (ref <5.7)
Mean Plasma Glucose: 123 mg/dL
eAG (mmol/L): 6.8 mmol/L

## 2023-03-11 ENCOUNTER — Encounter: Payer: HMO | Admitting: Family Medicine

## 2023-03-12 ENCOUNTER — Encounter: Payer: Self-pay | Admitting: Family Medicine

## 2023-03-12 ENCOUNTER — Ambulatory Visit (INDEPENDENT_AMBULATORY_CARE_PROVIDER_SITE_OTHER): Payer: HMO | Admitting: Family Medicine

## 2023-03-12 VITALS — BP 144/64 | Ht 62.0 in | Wt 124.0 lb

## 2023-03-12 DIAGNOSIS — E559 Vitamin D deficiency, unspecified: Secondary | ICD-10-CM

## 2023-03-12 DIAGNOSIS — R7309 Other abnormal glucose: Secondary | ICD-10-CM | POA: Diagnosis not present

## 2023-03-12 DIAGNOSIS — M25561 Pain in right knee: Secondary | ICD-10-CM

## 2023-03-12 DIAGNOSIS — I10 Essential (primary) hypertension: Secondary | ICD-10-CM

## 2023-03-12 DIAGNOSIS — M2351 Chronic instability of knee, right knee: Secondary | ICD-10-CM

## 2023-03-12 DIAGNOSIS — E782 Mixed hyperlipidemia: Secondary | ICD-10-CM

## 2023-03-12 DIAGNOSIS — G8929 Other chronic pain: Secondary | ICD-10-CM

## 2023-03-12 MED ORDER — ROSUVASTATIN CALCIUM 10 MG PO TABS: 10.0000 mg | ORAL_TABLET | Freq: Every day | ORAL | 3 refills | Status: DC

## 2023-03-12 NOTE — Progress Notes (Signed)
Subjective:    Patient ID: Heidi Bell, female    DOB: 1950/10/31, 72 y.o.   MRN: [id number]  Heidi Bell is a 72 y.o. female presenting on 03/12/2023 for Medical Management of Chronic Issues (Follow up visit)   HPI  Discussed the use of AI scribe software for clinical note transcription with the patient, who gave verbal consent to proceed.      Elevated A1c / Pre-Diabetes A1c 5.9 - prior range stable 5.8-5.9 She attributes improvement to lifestyle changes. No history of DM Not checking Sugar Never on medication   CHRONIC HTN: Elevated BP here in office and at home.  Last home BP reading normal range recently 130-140. This morning she was more active and attributes raised BP on that. Current Meds - HCTZ 25mg  daily, Losartan 100mg , Amlodipine 10mg  daily She admits some new lower ext edema on Amlodipine - OFF Potassium supplement Reports good compliance, took meds today. Tolerating well, w/o complaints. Admits occasional dizzy episode when forward bending, temporary episode. Denies CP, dyspnea, HA, lightheadedness    Vitamin D Deficiency Improved VIt D 59   Chronic Anxiety Stable on Sertraline, continues on med. Admits white coat hypertension at times.   HYPERLIPIDEMIA: - Reports no concerns. Last lipid panel 03/2023, controlled LDL 70s - Currently taking Rosuvastatin 10mg  nightly, tolerating well without side effects or myalgias   Night time congestion seasonal, maybe allergic recent URI  Toenail issue R Great toenail with small area discoloration darker distal end of toenail. Some thickening.      03/12/2023    9:25 AM 09/27/2022    1:39 PM 09/07/2022    8:11 AM  Depression screen PHQ 2/9  Decreased Interest 0 0 0  Down, Depressed, Hopeless 0 0 0  PHQ - 2 Score 0 0 0  Altered sleeping  0 0  Tired, decreased energy  0 0  Change in appetite  0 0  Feeling bad or failure about yourself   0 0  Trouble concentrating  0 0  Moving slowly or  fidgety/restless  0 0  Suicidal thoughts  0 0  PHQ-9 Score  0 0  Difficult doing work/chores  Not difficult at all Not difficult at all       03/12/2023    9:25 AM 09/07/2022    8:12 AM 03/08/2022   10:12 AM 09/05/2021    8:53 AM  GAD 7 : Generalized Anxiety Score  Nervous, Anxious, on Edge 1 3 1 1   Control/stop worrying 0 2 1 1   Worry too much - different things 0 2 1 1   Trouble relaxing 1 2 1 1   Restless 1 2 1 1   Easily annoyed or irritable  1 0 0  Afraid - awful might happen 1 0 1 1  Total GAD 7 Score  12 6 6   Anxiety Difficulty  Not difficult at all Not difficult at all Not difficult at all     Past Medical History:  Diagnosis Date   Anxiety    Atypical mole 06/16/2018   L mid lat back near braline/mod   Basal cell carcinoma 04/22/2018   L cheek/excision   Hyperlipidemia    Hypertension    Pre-diabetes    Past Surgical History:  Procedure Laterality Date   ABDOMINAL HYSTERECTOMY     boil removed  1984   BREAST CYST ASPIRATION Left 1994   Dr. Lemar Livings   BREAST CYST ASPIRATION Left 10/2017   COLONOSCOPY WITH PROPOFOL N/A 01/12/2015   Procedure: COLONOSCOPY WITH  PROPOFOL;  Surgeon: Kieth Brightly, MD;  Location: [facility];  Service: Endoscopy;  Laterality: N/A;   COLONOSCOPY WITH PROPOFOL N/A 04/06/2021   Procedure: COLONOSCOPY WITH PROPOFOL;  Surgeon: Midge Minium, MD;  Location: [facility];  Service: Endoscopy;  Laterality: N/A;   POLYPECTOMY  04/06/2021   Procedure: POLYPECTOMY;  Surgeon: Midge Minium, MD;  Location: [facility];  Service: Endoscopy;;   SKIN CANCER EXCISION     left shoulder, mid back and left cheek.    Social History   Socioeconomic History   Marital status: Single    Spouse name: Not on file   Number of children: 0   Years of education: Not on file   Highest education level: High school graduate  Occupational History   Occupation: retired  Tobacco Use   Smoking status: Never   Smokeless tobacco: Never  Vaping  Use   Vaping status: Never Used  Substance and Sexual Activity   Alcohol use: No    Alcohol/week: 0.0 standard drinks of alcohol   Drug use: No   Sexual activity: Not on file  Other Topics Concern   Not on file  Social History Narrative   Not on file   Social Determinants of Health   Financial Resource Strain: Low Risk  (09/27/2022)   Overall Financial Resource Strain (CARDIA)    Difficulty of Paying Living Expenses: Not hard at all  Food Insecurity: No Food Insecurity (09/27/2022)   Hunger Vital Sign    Worried About Running Out of Food in the Last Year: Never true    Ran Out of Food in the Last Year: Never true  Transportation Needs: No Transportation Needs (09/27/2022)   PRAPARE - Administrator, Civil Service (Medical): No    Lack of Transportation (Non-Medical): No  Physical Activity: Insufficiently Active (09/27/2022)   Exercise Vital Sign    Days of Exercise per Week: 3 days    Minutes of Exercise per Session: 30 min  Stress: No Stress Concern Present (09/27/2022)   [employer] of Occupational Health - Occupational Stress Questionnaire    Feeling of Stress : Not at all  Social Connections: Socially Isolated (09/27/2022)   Social Connection and Isolation Panel [NHANES]    Frequency of Communication with Friends and Family: Once a week    Frequency of Social Gatherings with Friends and Family: More than three times a week    Attends Religious Services: Never    Database administrator or Organizations: No    Attends Banker Meetings: Never    Marital Status: Never married  Intimate Partner Violence: Not At Risk (09/27/2022)   Humiliation, Afraid, Rape, and Kick questionnaire    Fear of Current or Ex-Partner: No    Emotionally Abused: No    Physically Abused: No    Sexually Abused: No   Family History  Problem Relation Age of Onset   Colon cancer Father    COPD Mother    Lung cancer Mother    Bleeding Disorder Mother    Breast cancer  Sister    Current Outpatient Medications on File Prior to Visit  Medication Sig   amLODipine (NORVASC) 10 MG tablet Take 1 tablet (10 mg total) by mouth daily.   Ascorbic Acid (VITAMIN C) 500 MG CAPS Take 500 mg by mouth daily. (Patient taking differently: Take 1,000 mg by mouth daily.)   Calcium Carb-Cholecalciferol 600-800 MG-UNIT TABS Take by mouth.   Garlic 1000 MG CAPS Take by mouth  2 (two) times daily.    glucosamine-chondroitin 500-400 MG tablet Take 1 tablet by mouth 2 (two) times daily. (Patient taking differently: Take 1 tablet by mouth 2 (two) times daily.)   hydrochlorothiazide (HYDRODIURIL) 25 MG tablet Take 1 tablet (25 mg total) by mouth daily.   ketoconazole (NIZORAL) 2 % cream Apply to the feet QHS.   losartan (COZAAR) 100 MG tablet Take 1 tablet (100 mg total) by mouth daily.   Multiple Vitamins-Minerals (MULTIVITAMIN WITH MINERALS) tablet Take 1 tablet by mouth daily.   pyridOXINE (VITAMIN B-6) 100 MG tablet Take 100 mg by mouth daily.   sertraline (ZOLOFT) 50 MG tablet Take 1 tablet (50 mg total) by mouth daily.   No current facility-administered medications on file prior to visit.    Review of Systems  Constitutional:  Negative for activity change, appetite change, chills, diaphoresis, fatigue and fever.  HENT:  Negative for congestion and hearing loss.   Eyes:  Negative for visual disturbance.  Respiratory:  Negative for cough, chest tightness, shortness of breath and wheezing.   Cardiovascular:  Negative for chest pain, palpitations and leg swelling.  Gastrointestinal:  Negative for abdominal pain, constipation, diarrhea, nausea and vomiting.  Genitourinary:  Negative for dysuria, frequency and hematuria.  Musculoskeletal:  Negative for arthralgias and neck pain.  Skin:  Negative for rash.  Neurological:  Negative for dizziness, weakness, light-headedness, numbness and headaches.  Hematological:  Negative for adenopathy.  Psychiatric/Behavioral:  Negative for  behavioral problems, dysphoric mood and sleep disturbance.    Per HPI unless specifically indicated above     Objective:    BP (!) 144/64 (BP Location: Left Arm, Cuff Size: Normal)   Ht 5\' 2"  (1.575 m)   Wt 124 lb (56.2 kg)   BMI 22.68 kg/m   Wt Readings from Last 3 Encounters:  03/12/23 124 lb (56.2 kg)  09/27/22 139 lb 6.4 oz (63.2 kg)  09/07/22 137 lb 12.8 oz (62.5 kg)    Physical Exam Vitals and nursing note reviewed.  Constitutional:      General: She is not in acute distress.    Appearance: She is well-developed. She is not diaphoretic.     Comments: Well-appearing, comfortable, cooperative  HENT:     Head: Normocephalic and atraumatic.  Eyes:     General:        Right eye: No discharge.        Left eye: No discharge.     Conjunctiva/sclera: Conjunctivae normal.     Pupils: Pupils are equal, round, and reactive to light.  Neck:     Thyroid: No thyromegaly.  Cardiovascular:     Rate and Rhythm: Normal rate and regular rhythm.     Pulses: Normal pulses.     Heart sounds: Normal heart sounds. No murmur heard. Pulmonary:     Effort: Pulmonary effort is normal. No respiratory distress.     Breath sounds: Normal breath sounds. No wheezing or rales.  Abdominal:     General: Bowel sounds are normal. There is no distension.     Palpations: Abdomen is soft. There is no mass.     Tenderness: There is no abdominal tenderness.  Musculoskeletal:        General: No tenderness. Normal range of motion.     Cervical back: Normal range of motion and neck supple.     Right lower leg: Edema (+1 trace) present.     Left lower leg: Edema (+1 trace) present.     Comments: Upper /  Lower Extremities: - Normal muscle tone, strength bilateral upper extremities 5/5, lower extremities 5/5  Lymphadenopathy:     Cervical: No cervical adenopathy.  Skin:    General: Skin is warm and dry.     Findings: No erythema or rash.     Comments: Varicose veins  Neurological:     Mental Status:  She is alert and oriented to person, place, and time.     Comments: Distal sensation intact to light touch all extremities  Psychiatric:        Mood and Affect: Mood normal.        Behavior: Behavior normal.        Thought Content: Thought content normal.     Comments: Well groomed, good eye contact, normal speech and thoughts     Results for orders placed or performed in visit on 03/04/23  VITAMIN D 25 Hydroxy (Vit-D Deficiency, Fractures)  Result Value Ref Range   Vit D, 25-Hydroxy 59 30 - 100 ng/mL  TSH  Result Value Ref Range   TSH 2.11 0.40 - 4.50 mIU/L  Hemoglobin A1c  Result Value Ref Range   Hgb A1c MFr Bld 5.9 (H) <5.7 % of total Hgb   Mean Plasma Glucose 123 mg/dL   eAG (mmol/L) 6.8 mmol/L  Lipid panel  Result Value Ref Range   Cholesterol 165 <200 mg/dL   HDL 74 > OR = 50 mg/dL   Triglycerides 045 <409 mg/dL   LDL Cholesterol (Calc) 70 mg/dL (calc)   Total CHOL/HDL Ratio 2.2 <5.0 (calc)   Non-HDL Cholesterol (Calc) 91 <811 mg/dL (calc)  CBC with Differential/Platelet  Result Value Ref Range   WBC 8.8 3.8 - 10.8 Thousand/uL   RBC 4.36 3.80 - 5.10 Million/uL   Hemoglobin 12.8 11.7 - 15.5 g/dL   HCT 91.4 78.2 - 95.6 %   MCV 89.7 80.0 - 100.0 fL   MCH 29.4 27.0 - 33.0 pg   MCHC 32.7 32.0 - 36.0 g/dL   RDW 21.3 08.6 - 57.8 %   Platelets 240 140 - 400 Thousand/uL   MPV 11.5 7.5 - 12.5 fL   Neutro Abs 5,650 1,500 - 7,800 cells/uL   Absolute Lymphocytes 2,182 850 - 3,900 cells/uL   Absolute Monocytes 766 200 - 950 cells/uL   Eosinophils Absolute 141 15 - 500 cells/uL   Basophils Absolute 62 0 - 200 cells/uL   Neutrophils Relative % 64.2 %   Total Lymphocyte 24.8 %   Monocytes Relative 8.7 %   Eosinophils Relative 1.6 %   Basophils Relative 0.7 %  COMPLETE METABOLIC PANEL WITH GFR  Result Value Ref Range   Glucose, Bld 113 (H) 65 - 99 mg/dL   BUN 18 7 - 25 mg/dL   Creat 4.69 6.29 - 5.28 mg/dL   eGFR 80 > OR = 60 UX/LKG/4.01U2   BUN/Creatinine Ratio SEE  NOTE: 6 - 22 (calc)   Sodium 142 135 - 146 mmol/L   Potassium 3.9 3.5 - 5.3 mmol/L   Chloride 100 98 - 110 mmol/L   CO2 34 (H) 20 - 32 mmol/L   Calcium 10.3 8.6 - 10.4 mg/dL   Total Protein 7.8 6.1 - 8.1 g/dL   Albumin 5.1 3.6 - 5.1 g/dL   Globulin 2.7 1.9 - 3.7 g/dL (calc)   AG Ratio 1.9 1.0 - 2.5 (calc)   Total Bilirubin 0.9 0.2 - 1.2 mg/dL   Alkaline phosphatase (APISO) 89 37 - 153 U/L   AST 22 10 - 35 U/L  ALT 14 6 - 29 U/L      Assessment & Plan:   Problem List Items Addressed This Visit     Chronic pain of right knee   Elevated hemoglobin A1c - Primary   Essential hypertension   Relevant Medications   rosuvastatin (CRESTOR) 10 MG tablet   Hyperlipidemia   Relevant Medications   rosuvastatin (CRESTOR) 10 MG tablet   Vitamin D deficiency   Other Visit Diagnoses     Recurrent right knee instability            Updated Health Maintenance information Reviewed recent lab results with patient Encouraged improvement to lifestyle with diet and exercise Goal of weight loss  Hypertension Blood pressure elevated at today's visit. Patient is currently on maximum doses of three antihypertensive medications, including amlodipine, which may be contributing to lower extremity edema. -Advise patient to monitor blood pressure at home and record readings. -Consider adjusting medications at next visit if home readings consistently above goal of 140/90. - Consider Reduce or DC Amlodipine and switch to Beta Blocker Carvedilol  Lower extremity edema Likely secondary to amlodipine use. No signs of poor circulation or other concerning features on examination. -Advise patient to continue with leg elevation and use of Epsom salt baths as tolerated. -Consider reducing amlodipine dose if blood pressure control allows at next visit.  Nail changes Dark spot on toenail and thickening of nails. Likely due to previous trauma or possible fungal infection. No signs of worsening or associated  symptoms. -Advise patient to continue monitoring and maintaining good foot hygiene.  Hyperlipidemia Cholesterol levels within target range on current statin therapy. -Renew cholesterol medication prescription.  Prediabetes A1c stable at 5.9, indicating good control of blood glucose levels. -Encourage continued lifestyle modifications to maintain blood glucose control.  General Health Maintenance -Discussed potential benefit of pneumonia vaccine for patient and partner, to be considered for future administration. -Plan to follow up in six months.        No orders of the defined types were placed in this encounter.   Meds ordered this encounter  Medications   rosuvastatin (CRESTOR) 10 MG tablet    Sig: Take 1 tablet (10 mg total) by mouth at bedtime.    Dispense:  90 tablet    Refill:  3     Follow up plan: Return for 6 month PreDM A1c, HTN.  Saralyn Pilar, DO [facility] 03/12/2023, 8:48 AM

## 2023-03-12 NOTE — Patient Instructions (Addendum)
Thank you for coming to the office today.  Recent Labs    09/07/22 0818 03/04/23 0756  HGBA1C 5.9* 5.9*   Keep an eye on BP Record readings Goal < 140 / 90  We can adjust the meds next time Could consider switch or reduce Amlodipine and add a beta blocker if need to  Please schedule a Follow-up Appointment to: Return for 6 month PreDM A1c, HTN.  If you have any other questions or concerns, please feel free to call the office or send a message through MyChart. You may also schedule an earlier appointment if necessary.  Additionally, you may be receiving a survey about your experience at our office within a few days to 1 week by e-mail or mail. We value your feedback.  Saralyn Pilar, DO [facility], [place]

## 2023-06-07 DIAGNOSIS — H43813 Vitreous degeneration, bilateral: Secondary | ICD-10-CM | POA: Diagnosis not present

## 2023-06-07 DIAGNOSIS — I10 Essential (primary) hypertension: Secondary | ICD-10-CM | POA: Diagnosis not present

## 2023-06-07 DIAGNOSIS — H02882 Meibomian gland dysfunction right lower eyelid: Secondary | ICD-10-CM | POA: Diagnosis not present

## 2023-06-07 DIAGNOSIS — H02884 Meibomian gland dysfunction left upper eyelid: Secondary | ICD-10-CM | POA: Diagnosis not present

## 2023-06-07 DIAGNOSIS — H2513 Age-related nuclear cataract, bilateral: Secondary | ICD-10-CM | POA: Diagnosis not present

## 2023-09-10 ENCOUNTER — Ambulatory Visit (INDEPENDENT_AMBULATORY_CARE_PROVIDER_SITE_OTHER): Payer: Self-pay | Admitting: Family Medicine

## 2023-09-10 ENCOUNTER — Encounter: Payer: Self-pay | Admitting: Family Medicine

## 2023-09-10 VITALS — BP 132/50 | HR 81 | Ht 62.0 in | Wt 125.1 lb

## 2023-09-10 DIAGNOSIS — R7309 Other abnormal glucose: Secondary | ICD-10-CM | POA: Diagnosis not present

## 2023-09-10 DIAGNOSIS — G8929 Other chronic pain: Secondary | ICD-10-CM | POA: Diagnosis not present

## 2023-09-10 DIAGNOSIS — M25561 Pain in right knee: Secondary | ICD-10-CM | POA: Diagnosis not present

## 2023-09-10 DIAGNOSIS — E782 Mixed hyperlipidemia: Secondary | ICD-10-CM | POA: Diagnosis not present

## 2023-09-10 DIAGNOSIS — R300 Dysuria: Secondary | ICD-10-CM | POA: Diagnosis not present

## 2023-09-10 DIAGNOSIS — M150 Primary generalized (osteo)arthritis: Secondary | ICD-10-CM

## 2023-09-10 DIAGNOSIS — I10 Essential (primary) hypertension: Secondary | ICD-10-CM | POA: Diagnosis not present

## 2023-09-10 LAB — POCT GLYCOSYLATED HEMOGLOBIN (HGB A1C): Hemoglobin A1C: 5.7 % — AB (ref 4.0–5.6)

## 2023-09-10 MED ORDER — AMLODIPINE BESYLATE 10 MG PO TABS: 10.0000 mg | ORAL_TABLET | Freq: Every day | ORAL | 1 refills | Status: DC

## 2023-09-10 NOTE — Patient Instructions (Addendum)
 Thank you for coming to the office today.  Recent Labs    03/04/23 0756 09/10/23 0857  HGBA1C 5.9* 5.7*   DUE for FASTING BLOOD WORK (no food or drink after midnight before the lab appointment, only water  or coffee without cream/sugar on the morning of)  SCHEDULE "Lab Only" visit in the morning at the clinic for lab draw in 6  MONTHS   - Make sure Lab Only appointment is at about 1 week before your next appointment, so that results will be available  For Lab Results, once available within 2-3 days of blood draw, you can can log in to MyChart online to view your results and a brief explanation. Also, we can discuss results at next follow-up visit.   Please schedule a Follow-up Appointment to: Return for 6 month fasting lab > 1 week later Annual Physical.  If you have any other questions or concerns, please feel free to call the office or send a message through MyChart. You may also schedule an earlier appointment if necessary.  Additionally, you may be receiving a survey about your experience at our office within a few days to 1 week by e-mail or mail. We value your feedback.  Domingo Friend, DO [facility], [place]

## 2023-09-10 NOTE — Progress Notes (Unsigned)
 Subjective:    Patient ID: Heidi Bell, female    DOB: [birth year], 73 y.o.   MRN: [id number]  VALYN LATCHFORD is a 73 y.o. female presenting on 09/10/2023 for Pre-Diabetes   HPI  Discussed the use of AI scribe software for clinical note transcription with the patient, who gave verbal consent to proceed.  History of Present Illness   Heidi Bell is a 73 year old female with hypertension who presents for a follow-up visit to monitor blood pressure and blood sugar levels.  Dysuria / Urinary Frequency She experienced a recent episode of dysuria this past Sunday but forgot to provide a urine sample. No current pain is reported.  Elevated A1c / Pre-Diabetes A1c 5.7 today, improved. Prior range 5.9. She does not emphasis low carb sugar diet as much. Increased vegetables No history of DM Not checking Sugar Never on medication   CHRONIC HTN: Her blood pressure was recorded at 132/50, consistent with her home readings. She is currently taking amlodipine  10 mg for hypertension and reports occasional swelling in her feet and legs, which she manages with salt soaks. She has not experienced significant fluid retention. Current Meds - HCTZ 25mg  daily, Losartan  100mg , Amlodipine  10mg  daily She admits some new lower ext edema on Amlodipine  - OFF Potassium supplement Reports good compliance, took meds today. Tolerating well, w/o complaints. Admits occasional dizzy episode when forward bending, temporary episode. Denies CP, dyspnea, HA, lightheadedness    Vitamin D  Deficiency Improved VIt D 59   Chronic Anxiety Stable on Sertraline , continues on med. Admits white coat hypertension at times.   HYPERLIPIDEMIA: - Reports no concerns. Last lipid panel 03/2023, controlled LDL 70s - Currently taking Rosuvastatin  10mg  nightly, tolerating well without side effects or myalgias   Osteoarthritis multiple joints She experiences more arthritis pain in the winter than in the summer. She  uses Voltaren cream regularly and takes Tylenol  as needed, though she has not taken any recently. She finds the cream very effective in managing her symptoms.         09/10/2023    8:57 AM 03/12/2023    9:25 AM 09/27/2022    1:39 PM  Depression screen PHQ 2/9  Decreased Interest 0 0 0  Down, Depressed, Hopeless 1 0 0  PHQ - 2 Score 1 0 0  Altered sleeping 1  0  Tired, decreased energy 0  0  Change in appetite 1  0  Feeling bad or failure about yourself  1  0  Trouble concentrating 1  0  Moving slowly or fidgety/restless 1  0  Suicidal thoughts 0  0  PHQ-9 Score 6  0  Difficult doing work/chores Not difficult at all  Not difficult at all       09/10/2023    8:57 AM 03/12/2023    9:25 AM 09/07/2022    8:12 AM 03/08/2022   10:12 AM  GAD 7 : Generalized Anxiety Score  Nervous, Anxious, on Edge 0 1 3 1   Control/stop worrying 1 0 2 1  Worry too much - different things 0 0 2 1  Trouble relaxing 1 1 2 1   Restless 0 1 2 1   Easily annoyed or irritable 1  1 0  Afraid - awful might happen 1 1 0 1  Total GAD 7 Score 4  12 6   Anxiety Difficulty Not difficult at all  Not difficult at all Not difficult at all    Social History   Tobacco Use  Smoking status: Never   Smokeless tobacco: Never  Vaping Use   Vaping status: Never Used  Substance Use Topics   Alcohol use: No    Alcohol/week: 0.0 standard drinks of alcohol   Drug use: No    Review of Systems Per HPI unless specifically indicated above     Objective:     BP (!) 132/50 (BP Location: Left Arm, Patient Position: Sitting, Cuff Size: Normal)   Pulse 81   Ht 5' 2 (1.575 m)   Wt 125 lb 2 oz (56.8 kg)   SpO2 98%   BMI 22.89 kg/m   Wt Readings from Last 3 Encounters:  09/10/23 125 lb 2 oz (56.8 kg)  03/12/23 124 lb (56.2 kg)  09/27/22 139 lb 6.4 oz (63.2 kg)    Physical Exam Vitals and nursing note reviewed.  Constitutional:      General: She is not in acute distress.    Appearance: She is well-developed. She  is not diaphoretic.     Comments: Well-appearing, comfortable, cooperative  HENT:     Head: Normocephalic and atraumatic.  Eyes:     General:        Right eye: No discharge.        Left eye: No discharge.     Conjunctiva/sclera: Conjunctivae normal.  Neck:     Thyroid : No thyromegaly.  Cardiovascular:     Rate and Rhythm: Normal rate and regular rhythm.     Heart sounds: Normal heart sounds. No murmur heard. Pulmonary:     Effort: Pulmonary effort is normal. No respiratory distress.     Breath sounds: Normal breath sounds. No wheezing or rales.  Musculoskeletal:        General: Normal range of motion.     Cervical back: Normal range of motion and neck supple.  Lymphadenopathy:     Cervical: No cervical adenopathy.  Skin:    General: Skin is warm and dry.     Findings: No erythema or rash.  Neurological:     Mental Status: She is alert and oriented to person, place, and time.  Psychiatric:        Behavior: Behavior normal.     Comments: Well groomed, good eye contact, normal speech and thoughts     Recent Labs    03/04/23 0756 09/10/23 0857  HGBA1C 5.9* 5.7*    Results for orders placed or performed in visit on 09/10/23  POCT HgB A1C   Collection Time: 09/10/23  8:57 AM  Result Value Ref Range   Hemoglobin A1C 5.7 (A) 4.0 - 5.6 %   HbA1c POC (<> result, manual entry)     HbA1c, POC (prediabetic range)     HbA1c, POC (controlled diabetic range)        Assessment & Plan:   Problem List Items Addressed This Visit     Chronic pain of right knee   Elevated hemoglobin A1c - Primary   Relevant Orders   POCT HgB A1C (Completed)   Essential hypertension   Relevant Medications   amLODipine  (NORVASC ) 10 MG tablet   Hyperlipidemia   Relevant Medications   amLODipine  (NORVASC ) 10 MG tablet   Primary osteoarthritis involving multiple joints   Other Visit Diagnoses       Dysuria       Relevant Orders   Urinalysis, Routine w reflex microscopic   Urine Culture         Urinary Symptoms Reported urinary burning once; urinalysis needed to rule out issues. - Order  urinalysis.  Pre-Diabetes Blood sugar improved from 5.9% to 5.7%. Emphasized dietary modifications to prevent future elevation. - Encourage low carbohydrate and low sugar intake. - Continue regular blood sugar monitoring.  Hypertension Blood pressure well-managed at 132/50 mmHg. Occasional swelling managed with salt and soaking. - Continue amlodipine  10 mg daily. - Monitor for significant swelling or side effects.  Osteoarthritis More pain in winter; managing with Voltaren cream and Tylenol . - Continue Voltaren cream. - Use Tylenol  as needed.  General Health Maintenance Encouraged healthy lifestyle choices, regular activity, and balanced diet. - Encourage regular physical activity and balanced diet. - Monitor weight and adjust lifestyle as needed.  Follow-up Multiple upcoming appointments; cancer treatment affects scheduling. - Schedule follow-up in early December. - Coordinate with specialists as needed.        Orders Placed This Encounter  Procedures   Urine Culture   Urinalysis, Routine w reflex microscopic   POCT HgB A1C    Meds ordered this encounter  Medications   amLODipine  (NORVASC ) 10 MG tablet    Sig: Take 1 tablet (10 mg total) by mouth daily.    Dispense:  90 tablet    Refill:  1    Add refills for future    Follow up plan: Return for 6 month fasting lab > 1 week later Annual Physical.  Future labs ordered for 03/11/24   Domingo Friend, DO [facility] 09/10/2023, 9:13 AM

## 2023-09-11 ENCOUNTER — Other Ambulatory Visit: Payer: Self-pay | Admitting: Family Medicine

## 2023-09-11 ENCOUNTER — Ambulatory Visit: Payer: Self-pay | Admitting: Family Medicine

## 2023-09-11 DIAGNOSIS — E782 Mixed hyperlipidemia: Secondary | ICD-10-CM

## 2023-09-11 DIAGNOSIS — R7309 Other abnormal glucose: Secondary | ICD-10-CM

## 2023-09-11 DIAGNOSIS — Z0000 Encounter for general adult medical examination without abnormal findings: Secondary | ICD-10-CM

## 2023-09-11 DIAGNOSIS — M150 Primary generalized (osteo)arthritis: Secondary | ICD-10-CM | POA: Insufficient documentation

## 2023-09-11 DIAGNOSIS — E559 Vitamin D deficiency, unspecified: Secondary | ICD-10-CM

## 2023-09-11 DIAGNOSIS — I10 Essential (primary) hypertension: Secondary | ICD-10-CM

## 2023-09-11 LAB — URINE CULTURE
MICRO NUMBER:: 16562399
Result:: NO GROWTH
SPECIMEN QUALITY:: ADEQUATE

## 2023-09-11 LAB — URINALYSIS, ROUTINE W REFLEX MICROSCOPIC
Bilirubin Urine: NEGATIVE
Glucose, UA: NEGATIVE
Hgb urine dipstick: NEGATIVE
Ketones, ur: NEGATIVE
Leukocytes,Ua: NEGATIVE
Nitrite: NEGATIVE
Protein, ur: NEGATIVE
Specific Gravity, Urine: 1.016 (ref 1.001–1.035)
pH: 7 (ref 5.0–8.0)

## 2023-09-17 ENCOUNTER — Other Ambulatory Visit: Payer: Self-pay | Admitting: Family Medicine

## 2023-09-17 DIAGNOSIS — Z1231 Encounter for screening mammogram for malignant neoplasm of breast: Secondary | ICD-10-CM

## 2023-09-18 ENCOUNTER — Telehealth: Payer: Self-pay | Admitting: Family Medicine

## 2023-09-18 NOTE — Telephone Encounter (Signed)
 Called 09/18/2023 to r/s AWV appt 10/03/23 due to [facility] out of office 10/03/2023.  Rosalee Collins; Care Guide Ambulatory Clinical Support [place] [facility] Direct Dial: [phone]

## 2023-10-03 ENCOUNTER — Ambulatory Visit: Payer: HMO

## 2023-11-18 ENCOUNTER — Ambulatory Visit: Admitting: Dermatology

## 2023-11-18 DIAGNOSIS — B353 Tinea pedis: Secondary | ICD-10-CM

## 2023-11-18 DIAGNOSIS — Z85828 Personal history of other malignant neoplasm of skin: Secondary | ICD-10-CM | POA: Diagnosis not present

## 2023-11-18 DIAGNOSIS — L814 Other melanin hyperpigmentation: Secondary | ICD-10-CM

## 2023-11-18 DIAGNOSIS — W908XXA Exposure to other nonionizing radiation, initial encounter: Secondary | ICD-10-CM

## 2023-11-18 DIAGNOSIS — Z1283 Encounter for screening for malignant neoplasm of skin: Secondary | ICD-10-CM

## 2023-11-18 DIAGNOSIS — D1801 Hemangioma of skin and subcutaneous tissue: Secondary | ICD-10-CM

## 2023-11-18 DIAGNOSIS — Z7189 Other specified counseling: Secondary | ICD-10-CM

## 2023-11-18 DIAGNOSIS — L570 Actinic keratosis: Secondary | ICD-10-CM | POA: Diagnosis not present

## 2023-11-18 DIAGNOSIS — Z79899 Other long term (current) drug therapy: Secondary | ICD-10-CM

## 2023-11-18 DIAGNOSIS — L578 Other skin changes due to chronic exposure to nonionizing radiation: Secondary | ICD-10-CM

## 2023-11-18 DIAGNOSIS — D229 Melanocytic nevi, unspecified: Secondary | ICD-10-CM

## 2023-11-18 DIAGNOSIS — Z86018 Personal history of other benign neoplasm: Secondary | ICD-10-CM

## 2023-11-18 MED ORDER — KETOCONAZOLE 2 % EX CREA: TOPICAL_CREAM | CUTANEOUS | 11 refills | Status: AC

## 2023-11-18 NOTE — Patient Instructions (Addendum)
Actinic keratoses are precancerous spots that appear secondary to cumulative UV radiation exposure/sun exposure over time. They are chronic with expected duration over 1 year. A portion of actinic keratoses will progress to squamous cell carcinoma of the skin. It is not possible to reliably predict which spots will progress to skin cancer and so treatment is recommended to prevent development of skin cancer.  Recommend daily broad spectrum sunscreen SPF 30+ to sun-exposed areas, reapply every 2 hours as needed.  Recommend staying in the shade or wearing long sleeves, sun glasses (UVA+UVB protection) and wide brim hats (4-inch brim around the entire circumference of the hat). Call for new or changing lesions.    Cryotherapy Aftercare  Wash gently with soap and water everyday.   Apply Vaseline and Band-Aid daily until healed.     Melanoma ABCDEs  Melanoma is the most dangerous type of skin cancer, and is the leading cause of death from skin disease.  You are more likely to develop melanoma if you: Have light-colored skin, light-colored eyes, or red or blond hair Spend a lot of time in the sun Tan regularly, either outdoors or in a tanning bed Have had blistering sunburns, especially during childhood Have a close family member who has had a melanoma Have atypical moles or large birthmarks  Early detection of melanoma is key since treatment is typically straightforward and cure rates are extremely high if we catch it early.   The first sign of melanoma is often a change in a mole or a new dark spot.  The ABCDE system is a way of remembering the signs of melanoma.  A for asymmetry:  The two halves do not match. B for border:  The edges of the growth are irregular. C for color:  A mixture of colors are present instead of an even brown color. D for diameter:  Melanomas are usually (but not always) greater than 6mm - the size of a pencil eraser. E for evolution:  The spot keeps changing in  size, shape, and color.  Please check your skin once per month between visits. You can use a small mirror in front and a large mirror behind you to keep an eye on the back side or your body.   If you see any new or changing lesions before your next follow-up, please call to schedule a visit.  Please continue daily skin protection including broad spectrum sunscreen SPF 30+ to sun-exposed areas, reapplying every 2 hours as needed when you're outdoors.   Staying in the shade or wearing long sleeves, sun glasses (UVA+UVB protection) and wide brim hats (4-inch brim around the entire circumference of the hat) are also recommended for sun protection.    Due to recent changes in healthcare laws, you may see results of your pathology and/or laboratory studies on MyChart before the doctors have had a chance to review them. We understand that in some cases there may be results that are confusing or concerning to you. Please understand that not all results are received at the same time and often the doctors may need to interpret multiple results in order to provide you with the best plan of care or course of treatment. Therefore, we ask that you please give [place] 2 business days to thoroughly review all your results before contacting the office for clarification. Should we see a critical lab result, you will be contacted sooner.   If You Need Anything After Your Visit  If you have any questions or concerns for  your doctor, please call our main line at [phone] and press option 4 to reach your doctor's medical assistant. If no one answers, please leave a voicemail as directed and we will return your call as soon as possible. Messages left after 4 pm will be answered the following business day.   You may also send [place] a message via MyChart. We typically respond to MyChart messages within 1-2 business days.  For prescription refills, please ask your pharmacy to contact our office. Our fax number is  [phone].  If you have an urgent issue when the clinic is closed that cannot wait until the next business day, you can page your doctor at the number below.    Please note that while we do our best to be available for urgent issues outside of office hours, we are not available 24/7.   If you have an urgent issue and are unable to reach [place], you may choose to seek medical care at your doctor's office, retail clinic, urgent care center, or emergency room.  If you have a medical emergency, please immediately call 911 or go to the emergency department.  Pager Numbers  - Dr. Gwen Pounds: [phone]  - Dr. Roseanne Reno: [phone]  - Dr. Katrinka Blazing: [phone]   In the event of inclement weather, please call our main line at [phone] for an update on the status of any delays or closures.  Dermatology Medication Tips: Please keep the boxes that topical medications come in in order to help keep track of the instructions about where and how to use these. Pharmacies typically print the medication instructions only on the boxes and not directly on the medication tubes.   If your medication is too expensive, please contact our office at [phone] option 4 or send [place] a message through MyChart.   We are unable to tell what your co-pay for medications will be in advance as this is different depending on your insurance coverage. However, we may be able to find a substitute medication at lower cost or fill out paperwork to get insurance to cover a needed medication.   If a prior authorization is required to get your medication covered by your insurance company, please allow [place] 1-2 business days to complete this process.  Drug prices often vary depending on where the prescription is filled and some pharmacies may offer cheaper prices.  The website www.goodrx.com contains coupons for medications through different pharmacies. The prices here do not account for what the cost may be with help from  insurance (it may be cheaper with your insurance), but the website can give you the price if you did not use any insurance.  - You can print the associated coupon and take it with your prescription to the pharmacy.  - You may also stop by our office during regular business hours and pick up a GoodRx coupon card.  - If you need your prescription sent electronically to a different pharmacy, notify our office through [facility] or by phone at [phone] option 4.     Si Usted Necesita Algo Despus de Su Visita  Tambin puede enviarnos un mensaje a travs de Clinical cytogeneticist. Por lo general respondemos a los mensajes de MyChart en el transcurso de 1 a 2 das hbiles.  Para renovar recetas, por favor pida a su farmacia que se ponga en contacto con nuestra oficina. Annie Sable de fax es [place] [phone].  Si tiene un asunto urgente cuando la clnica est cerrada y que no puede esperar Teacher, adult education  el siguiente da hbil, puede llamar/localizar a su doctor(a) al nmero que aparece a continuacin.   Por favor, tenga en cuenta que aunque hacemos todo lo posible para estar disponibles para asuntos urgentes fuera del horario de [place], no estamos disponibles las 24 horas del da, los 7 [address]  [address] de la [place].   Si tiene un problema urgente y no puede comunicarse con nosotros, puede optar por buscar atencin mdica  en el consultorio de su doctor(a), en una clnica privada, en un centro de atencin urgente o en una sala de emergencias.  Si tiene Engineer, drilling, por favor llame inmediatamente al 911 o vaya a la sala de emergencias.  Nmeros de bper  - Dr. Gwen Pounds: [phone]  - Dra. Roseanne Reno: [phone]  - Dr. Katrinka Blazing: [phone]   En caso de inclemencias del tiempo, por favor llame a Lacy Duverney principal al [phone] para una actualizacin sobre el [place] de cualquier retraso o cierre.  Consejos para la medicacin en dermatologa: Por favor, guarde las cajas en las que vienen los  medicamentos de uso tpico para ayudarle a seguir las instrucciones sobre dnde y cmo usarlos. Las farmacias generalmente imprimen las instrucciones del medicamento slo en las cajas y no directamente en los tubos del [place].   Si su medicamento es muy caro, por favor, pngase en contacto con Rolm Gala llamando al [phone] y presione la opcin 4 o envenos un mensaje a travs de Clinical cytogeneticist.   No podemos decirle cul ser su copago por los medicamentos por adelantado ya que esto es diferente dependiendo de la cobertura de su seguro. Sin embargo, es posible que podamos encontrar un medicamento sustituto a Audiological scientist un formulario para que el seguro cubra el medicamento que se considera necesario.   Si se requiere una autorizacin previa para que su compaa de seguros [place] su medicamento, por favor permtanos de 1 a 2 das hbiles para completar [address]  Los precios de los medicamentos varan con frecuencia dependiendo del Environmental consultant de dnde se surte la receta y alguna farmacias pueden ofrecer precios ms baratos.  El sitio web www.goodrx.com tiene cupones para medicamentos de Health and safety inspector. Los precios aqu no tienen en cuenta lo que podra costar con la ayuda del seguro (puede ser ms barato con su seguro), pero el sitio web puede darle el precio si no utiliz Tourist information centre manager.  - Puede imprimir el cupn correspondiente y llevarlo con su receta a la farmacia.  - Tambin puede pasar por nuestra oficina durante el horario de atencin regular y Education officer, museum una tarjeta de cupones de GoodRx.  - Si necesita que su receta se enve electrnicamente a una farmacia diferente, informe a nuestra oficina a travs de MyChart de [place] o por telfono llamando al [phone] y presione la opcin 4.

## 2023-11-18 NOTE — Progress Notes (Unsigned)
 Follow-Up Visit   Subjective  Heidi Bell is a 73 y.o. female who presents for the following: Skin Cancer Screening and Full Body Skin Exam Hx of aks, isks, dysplastic nevi, bcc, tinea pedis using ketoconazole  cream   The patient presents for Total-Body Skin Exam (TBSE) for skin cancer screening and mole check. The patient has spots, moles and lesions to be evaluated, some may be new or changing and the patient may have concern these could be cancer.  The following portions of the chart were reviewed this encounter and updated as appropriate: medications, allergies, medical history  Review of Systems:  No other skin or systemic complaints except as noted in HPI or Assessment and Plan.  Objective  Well appearing patient in no apparent distress; mood and affect are within normal limits.  A full examination was performed including scalp, head, eyes, ears, nose, lips, neck, chest, axillae, abdomen, back, buttocks, bilateral upper extremities, bilateral lower extremities, hands, feet, fingers, toes, fingernails, and toenails. All findings within normal limits unless otherwise noted below.   Relevant physical exam findings are noted in the Assessment and Plan.  left forehead x 1, right cheek x 1 (2) Erythematous thin papules/macules with gritty scale.   Assessment & Plan   SKIN CANCER SCREENING PERFORMED TODAY.  ACTINIC DAMAGE - Chronic condition, secondary to cumulative UV/sun exposure - diffuse scaly erythematous macules with underlying dyspigmentation - Recommend daily broad spectrum sunscreen SPF 30+ to sun-exposed areas, reapply every 2 hours as needed.  - Staying in the shade or wearing long sleeves, sun glasses (UVA+UVB protection) and wide brim hats (4-inch brim around the entire circumference of the hat) are also recommended for sun protection.  - Call for new or changing lesions.  LENTIGINES, SEBORRHEIC KERATOSES, HEMANGIOMAS - Benign normal skin lesions -  Benign-appearing - Call for any changes  MELANOCYTIC NEVI - Tan-brown and/or pink-flesh-colored symmetric macules and papules - Benign appearing on exam today - Observation - Call clinic for new or changing moles - Recommend daily use of broad spectrum spf 30+ sunscreen to sun-exposed areas.   TINEA PEDIS Exam: Scaling and maceration web spaces and over distal and lateral soles. Chronic and persistent condition with duration or expected duration over one year. Condition is improving with treatment but not currently at goal. Treatment Plan: Continue ketoconazole  2% cream to feet and in between toes daily.    HISTORY OF BASAL CELL CARCINOMA OF THE SKIN 04/22/2018 left cheek - excision - No evidence of recurrence today - Recommend regular full body skin exams - Recommend daily broad spectrum sunscreen SPF 30+ to sun-exposed areas, reapply every 2 hours as needed.  - Call if any new or changing lesions are noted between office visits  HISTORY OF DYSPLASTIC NEVUS 06/16/2018 left mid lateral back near bra line - moderate  No evidence of recurrence today Recommend regular full body skin exams Recommend daily broad spectrum sunscreen SPF 30+ to sun-exposed areas, reapply every 2 hours as needed.  Call if any new or changing lesions are noted between office visits  ACTINIC KERATOSIS (2) left forehead x 1, right cheek x 1 (2) Actinic keratoses are precancerous spots that appear secondary to cumulative UV radiation exposure/sun exposure over time. They are chronic with expected duration over 1 year. A portion of actinic keratoses will progress to squamous cell carcinoma of the skin. It is not possible to reliably predict which spots will progress to skin cancer and so treatment is recommended to prevent development of skin cancer.  Recommend daily broad spectrum sunscreen SPF 30+ to sun-exposed areas, reapply every 2 hours as needed.  Recommend staying in the shade or wearing long sleeves, sun  glasses (UVA+UVB protection) and wide brim hats (4-inch brim around the entire circumference of the hat). Call for new or changing lesions. Destruction of lesion - left forehead x 1, right cheek x 1 (2) Complexity: simple   Destruction method: cryotherapy   Informed consent: discussed and consent obtained   Timeout:  patient name, date of birth, surgical site, and procedure verified Lesion destroyed using liquid nitrogen: Yes   Region frozen until ice ball extended beyond lesion: Yes   Outcome: patient tolerated procedure well with no complications   Post-procedure details: wound care instructions given    TINEA PEDIS OF BOTH FEET   Related Medications ketoconazole  (NIZORAL ) 2 % cream Apply to the feet QHS. Return in about 1 year (around 11/17/2024) for TBSE.  IEleanor Blush, CMA, am acting as scribe for Alm Rhyme, MD.   Documentation: I have reviewed the above documentation for accuracy and completeness, and I agree with the above.  Alm Rhyme, MD

## 2023-11-19 ENCOUNTER — Encounter: Payer: Self-pay | Admitting: Dermatology

## 2023-11-21 ENCOUNTER — Ambulatory Visit
Admission: RE | Admit: 2023-11-21 | Discharge: 2023-11-21 | Disposition: A | Discharge status: Home / Self Care | Source: Ambulatory Visit | Attending: Family Medicine | Admitting: Family Medicine

## 2023-11-21 DIAGNOSIS — Z1231 Encounter for screening mammogram for malignant neoplasm of breast: Secondary | ICD-10-CM | POA: Insufficient documentation

## 2023-12-05 ENCOUNTER — Ambulatory Visit: Payer: HMO | Admitting: Dermatology

## 2024-01-03 ENCOUNTER — Ambulatory Visit

## 2024-03-02 ENCOUNTER — Other Ambulatory Visit: Payer: Self-pay

## 2024-03-02 DIAGNOSIS — I10 Essential (primary) hypertension: Secondary | ICD-10-CM

## 2024-03-02 MED ORDER — LOSARTAN POTASSIUM 100 MG PO TABS: 100.0000 mg | ORAL_TABLET | Freq: Every day | ORAL | 3 refills | Status: AC

## 2024-03-06 ENCOUNTER — Other Ambulatory Visit: Payer: Self-pay

## 2024-03-06 DIAGNOSIS — F419 Anxiety disorder, unspecified: Secondary | ICD-10-CM

## 2024-03-06 MED ORDER — SERTRALINE HCL 50 MG PO TABS: 50.0000 mg | ORAL_TABLET | Freq: Every day | ORAL | 3 refills | Status: AC

## 2024-03-11 ENCOUNTER — Other Ambulatory Visit

## 2024-03-11 DIAGNOSIS — R7309 Other abnormal glucose: Secondary | ICD-10-CM

## 2024-03-11 DIAGNOSIS — I10 Essential (primary) hypertension: Secondary | ICD-10-CM

## 2024-03-11 DIAGNOSIS — Z0000 Encounter for general adult medical examination without abnormal findings: Secondary | ICD-10-CM | POA: Diagnosis not present

## 2024-03-11 DIAGNOSIS — E782 Mixed hyperlipidemia: Secondary | ICD-10-CM | POA: Diagnosis not present

## 2024-03-11 DIAGNOSIS — E559 Vitamin D deficiency, unspecified: Secondary | ICD-10-CM

## 2024-03-12 LAB — CBC WITH DIFFERENTIAL/PLATELET
Absolute Lymphocytes: 1604 {cells}/uL (ref 850–3900)
Absolute Monocytes: 628 {cells}/uL (ref 200–950)
Basophils Absolute: 43 {cells}/uL (ref 0–200)
Basophils Relative: 0.7 %
Eosinophils Absolute: 146 {cells}/uL (ref 15–500)
Eosinophils Relative: 2.4 %
HCT: 37.3 % (ref 35.9–46.0)
Hemoglobin: 11.9 g/dL (ref 11.7–15.5)
MCH: 29.4 pg (ref 27.0–33.0)
MCHC: 31.9 g/dL (ref 31.6–35.4)
MCV: 92.1 fL (ref 81.4–101.7)
MPV: 12.7 fL — ABNORMAL HIGH (ref 7.5–12.5)
Monocytes Relative: 10.3 %
Neutro Abs: 3678 {cells}/uL (ref 1500–7800)
Neutrophils Relative %: 60.3 %
Platelets: 176 Thousand/uL (ref 140–400)
RBC: 4.05 Million/uL (ref 3.80–5.10)
RDW: 12.5 % (ref 11.0–15.0)
Total Lymphocyte: 26.3 %
WBC: 6.1 Thousand/uL (ref 3.8–10.8)

## 2024-03-12 LAB — COMPREHENSIVE METABOLIC PANEL WITH GFR
AG Ratio: 2.1 (calc) (ref 1.0–2.5)
ALT: 12 U/L (ref 6–29)
AST: 22 U/L (ref 10–35)
Albumin: 5 g/dL (ref 3.6–5.1)
Alkaline phosphatase (APISO): 72 U/L (ref 37–153)
BUN: 21 mg/dL (ref 7–25)
CO2: 32 mmol/L (ref 20–32)
Calcium: 9.8 mg/dL (ref 8.6–10.4)
Chloride: 104 mmol/L (ref 98–110)
Creat: 0.79 mg/dL (ref 0.60–1.00)
Globulin: 2.4 g/dL (ref 1.9–3.7)
Glucose, Bld: 97 mg/dL (ref 65–99)
Potassium: 4.5 mmol/L (ref 3.5–5.3)
Sodium: 142 mmol/L (ref 135–146)
Total Bilirubin: 0.9 mg/dL (ref 0.2–1.2)
Total Protein: 7.4 g/dL (ref 6.1–8.1)
eGFR: 78 mL/min/1.73m2 (ref >=60)

## 2024-03-12 LAB — LIPID PANEL
Cholesterol: 171 mg/dL (ref <200)
HDL: 89 mg/dL (ref >=50)
LDL Cholesterol (Calc): 68 mg/dL
Non-HDL Cholesterol (Calc): 82 mg/dL (ref <130)
Total CHOL/HDL Ratio: 1.9 (calc) (ref <5.0)
Triglycerides: 61 mg/dL (ref <150)

## 2024-03-12 LAB — VITAMIN D 25 HYDROXY (VIT D DEFICIENCY, FRACTURES): Vit D, 25-Hydroxy: 47 ng/mL (ref 30–100)

## 2024-03-12 LAB — TSH: TSH: 1.6 m[IU]/L (ref 0.40–4.50)

## 2024-03-12 LAB — HEMOGLOBIN A1C
Hgb A1c MFr Bld: 5.6 % (ref <5.7)
Mean Plasma Glucose: 114 mg/dL
eAG (mmol/L): 6.3 mmol/L

## 2024-03-20 ENCOUNTER — Emergency Department

## 2024-03-20 ENCOUNTER — Emergency Department
Admission: EM | Admit: 2024-03-20 | Discharge: 2024-03-20 | Disposition: A | Discharge status: Home / Self Care | Attending: Emergency Medicine | Admitting: Emergency Medicine

## 2024-03-20 ENCOUNTER — Other Ambulatory Visit: Payer: Self-pay

## 2024-03-20 DIAGNOSIS — I10 Essential (primary) hypertension: Secondary | ICD-10-CM | POA: Insufficient documentation

## 2024-03-20 DIAGNOSIS — S2231XA Fracture of one rib, right side, initial encounter for closed fracture: Secondary | ICD-10-CM | POA: Insufficient documentation

## 2024-03-20 DIAGNOSIS — Y92410 Unspecified street and highway as the place of occurrence of the external cause: Secondary | ICD-10-CM | POA: Insufficient documentation

## 2024-03-20 DIAGNOSIS — S29001A Unspecified injury of muscle and tendon of front wall of thorax, initial encounter: Secondary | ICD-10-CM | POA: Diagnosis present

## 2024-03-20 DIAGNOSIS — S20211A Contusion of right front wall of thorax, initial encounter: Secondary | ICD-10-CM

## 2024-03-20 LAB — CBG MONITORING, ED: Glucose-Capillary: 105 mg/dL — ABNORMAL HIGH (ref 70–99)
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
## 2024-03-20 NOTE — ED Notes (Signed)
 Pt in Xray at this time.
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
## 2024-03-23 ENCOUNTER — Encounter: Payer: Self-pay | Admitting: Family Medicine

## 2024-03-23 ENCOUNTER — Encounter: Admitting: Family Medicine

## 2024-03-23 VITALS — BP 140/54 | HR 77 | Ht 62.0 in | Wt 123.2 lb

## 2024-03-23 DIAGNOSIS — M150 Primary generalized (osteo)arthritis: Secondary | ICD-10-CM | POA: Diagnosis not present

## 2024-03-23 DIAGNOSIS — R7309 Other abnormal glucose: Secondary | ICD-10-CM

## 2024-03-23 DIAGNOSIS — F419 Anxiety disorder, unspecified: Secondary | ICD-10-CM

## 2024-03-23 DIAGNOSIS — E782 Mixed hyperlipidemia: Secondary | ICD-10-CM

## 2024-03-23 DIAGNOSIS — Z0000 Encounter for general adult medical examination without abnormal findings: Secondary | ICD-10-CM

## 2024-03-23 DIAGNOSIS — I10 Essential (primary) hypertension: Secondary | ICD-10-CM

## 2024-03-23 DIAGNOSIS — S298XXA Other specified injuries of thorax, initial encounter: Secondary | ICD-10-CM

## 2024-03-23 MED ORDER — ROSUVASTATIN CALCIUM 10 MG PO TABS: 10.0000 mg | ORAL_TABLET | Freq: Every day | ORAL | 3 refills | Status: AC

## 2024-03-23 MED ORDER — HYDROCHLOROTHIAZIDE 25 MG PO TABS: 25.0000 mg | ORAL_TABLET | Freq: Every day | ORAL | 3 refills | Status: AC

## 2024-03-23 MED ORDER — AMLODIPINE BESYLATE 10 MG PO TABS: 10.0000 mg | ORAL_TABLET | Freq: Every day | ORAL | 3 refills | Status: AC
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
## 2024-04-24 ENCOUNTER — Encounter: Payer: Self-pay | Admitting: Family Medicine

## 2024-04-30 IMAGING — DX DG KNEE COMPLETE 4+V*R*
5 series · 5 of 5 positions shown · non-contrast
Comparison: None Available.

CLINICAL DATA: Chronic knee pain

EXAM:
RIGHT KNEE - COMPLETE 4+ VIEW

[knee ap]
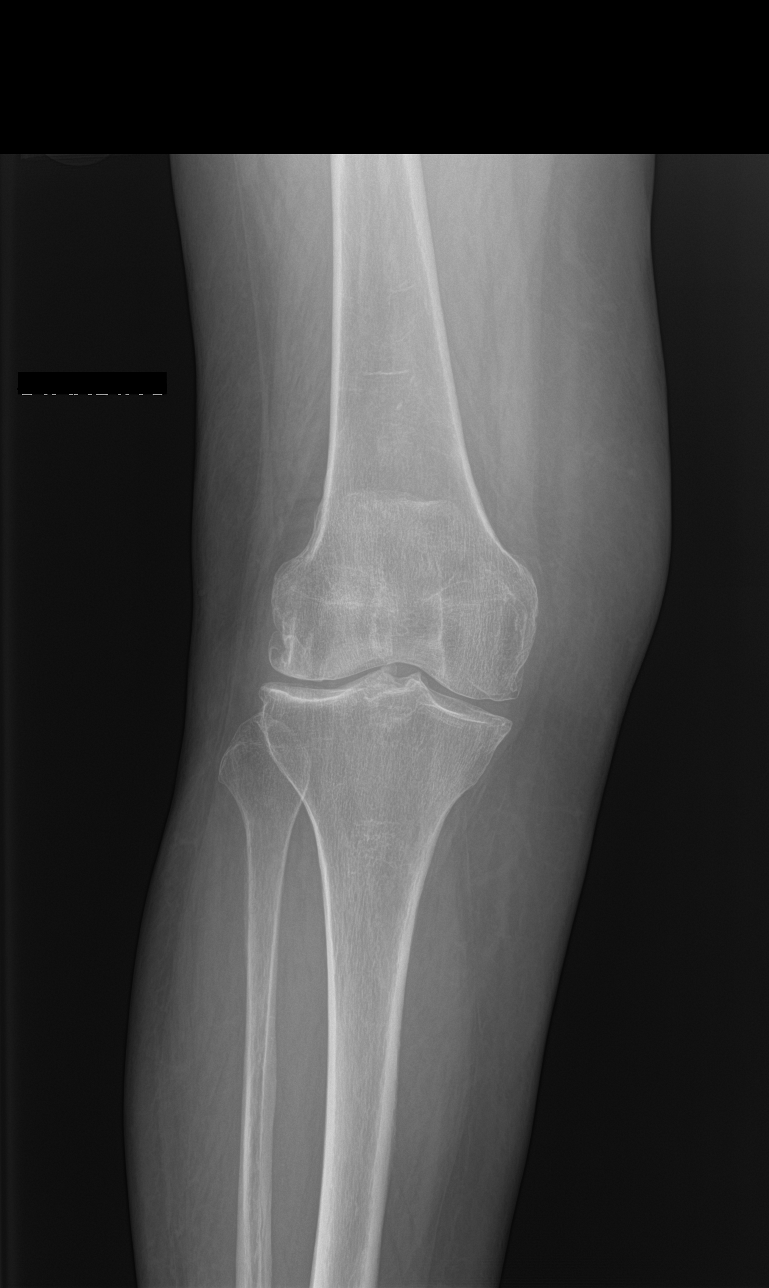

[knee lat (1 of 2)]
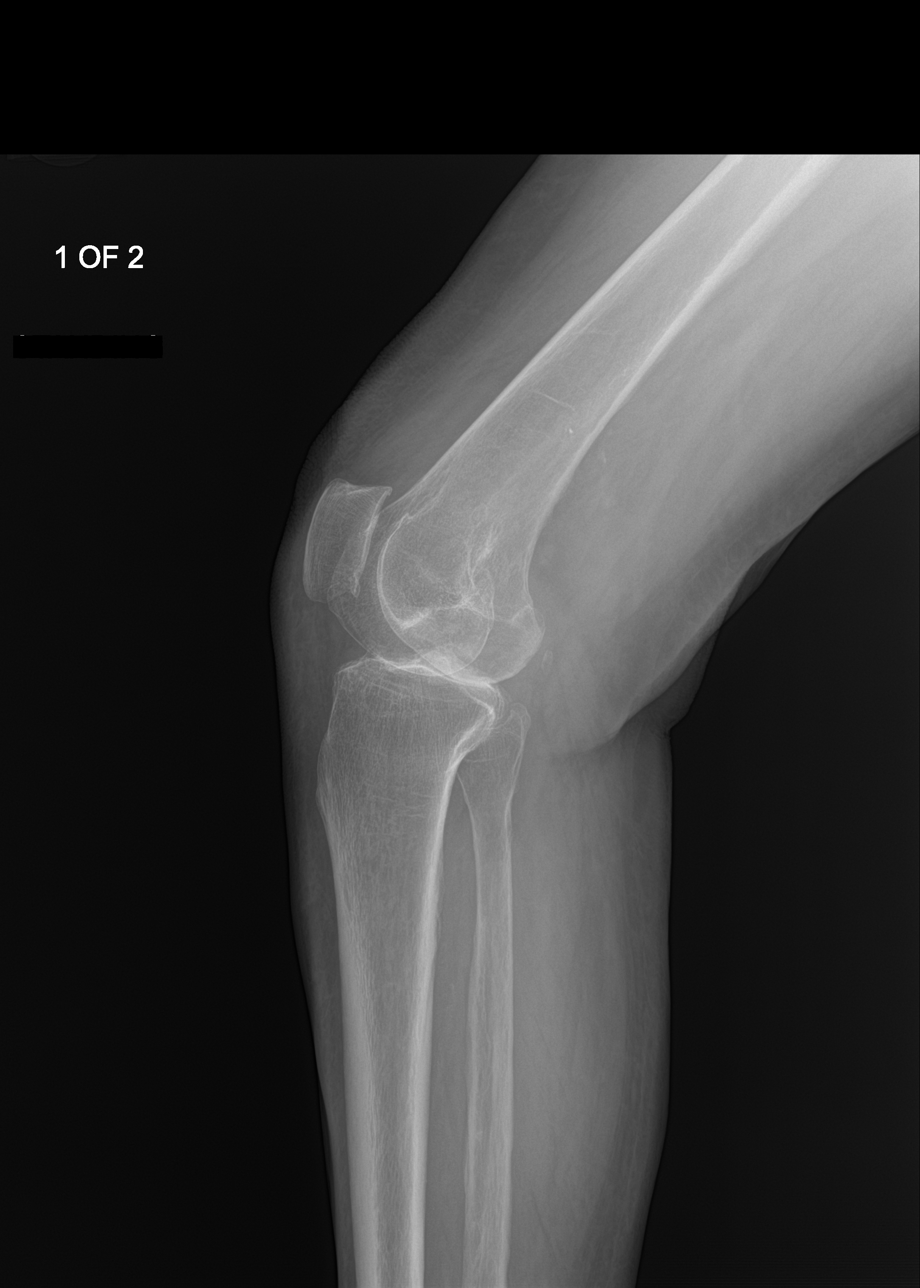

[patella skyline]
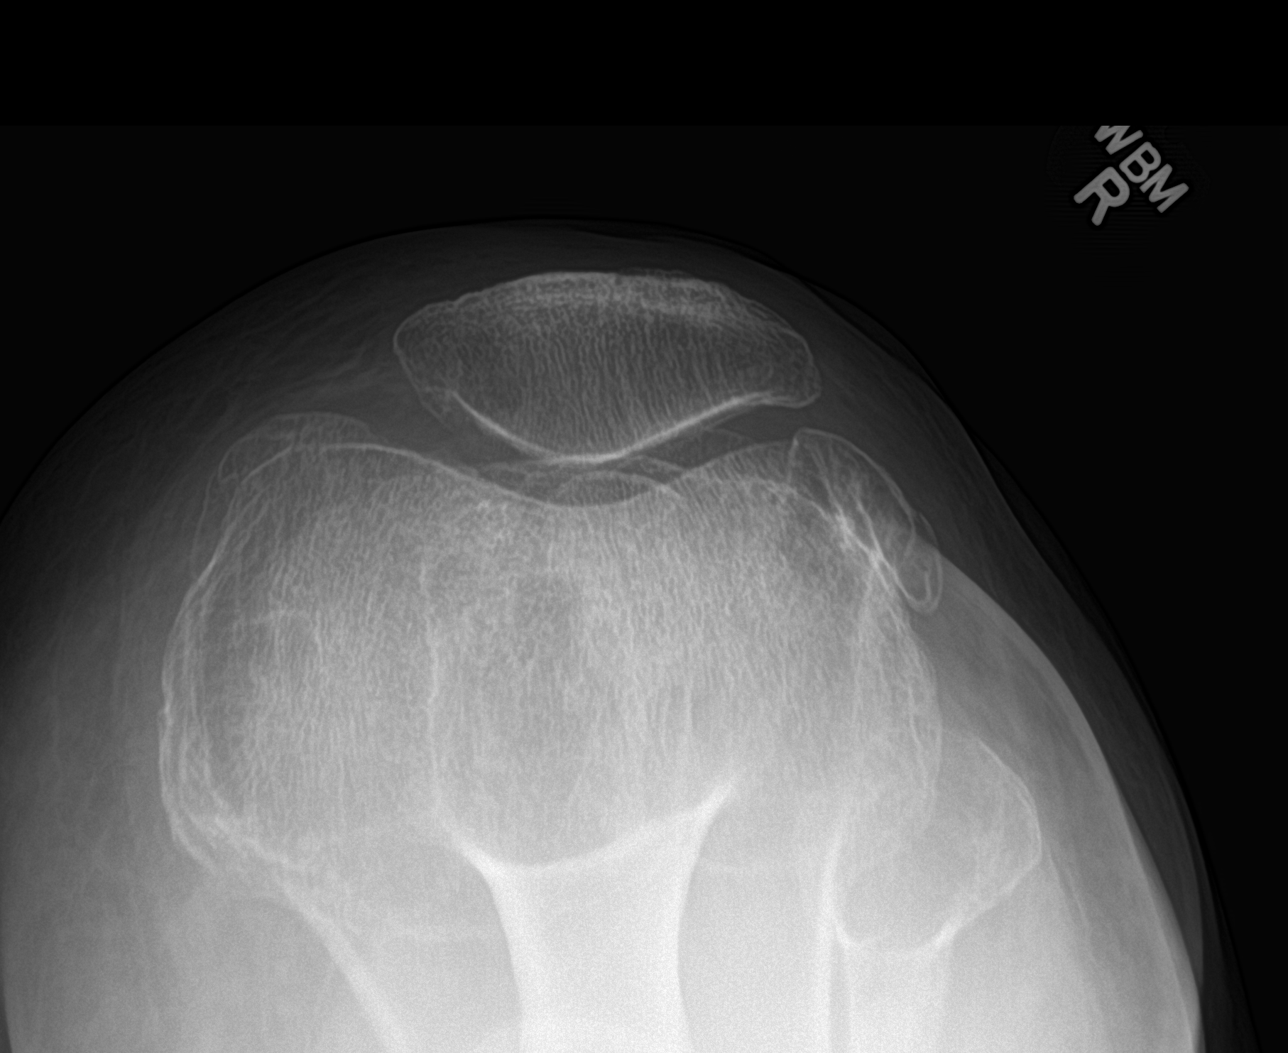

[knee lat (2 of 2)]
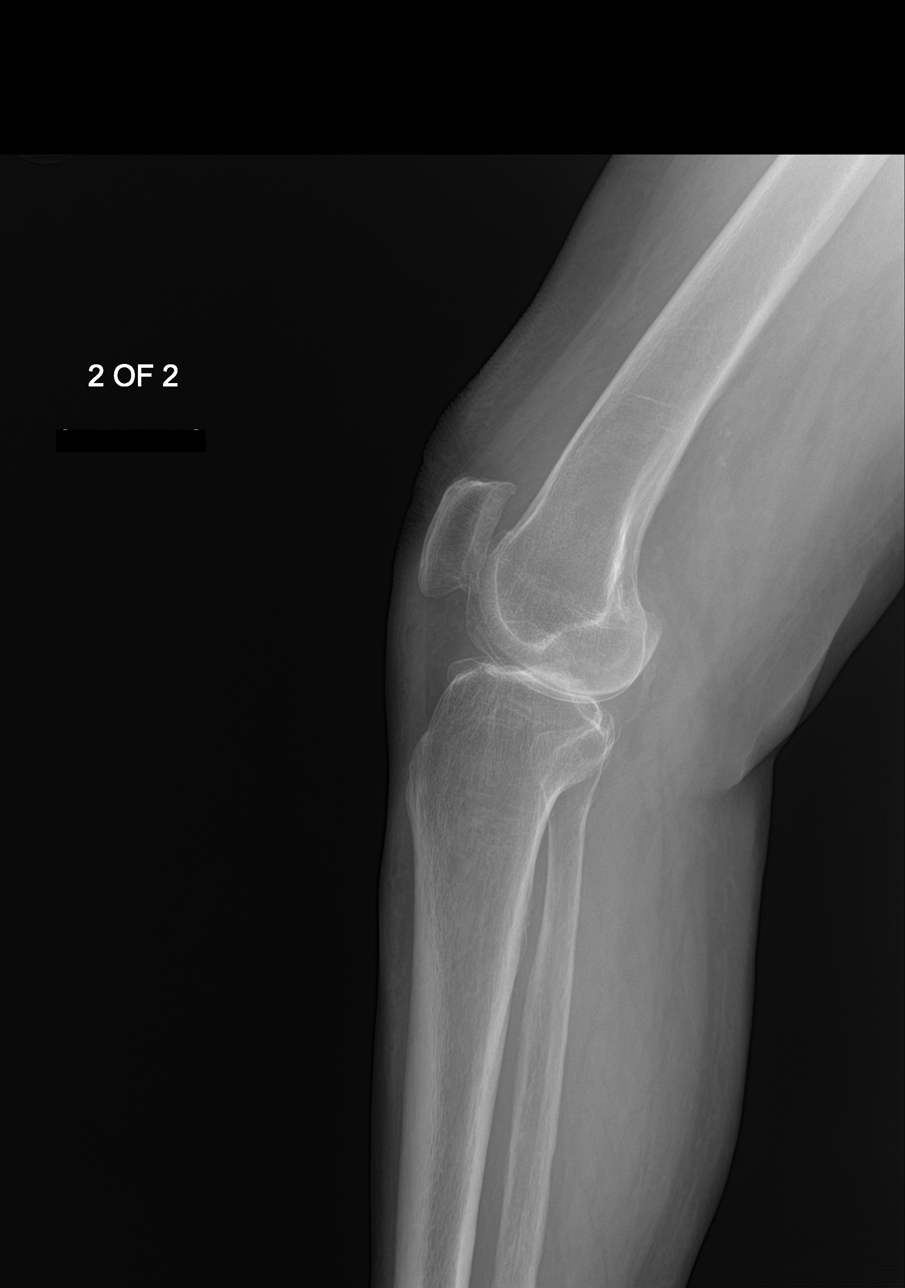

[knee tunnel]
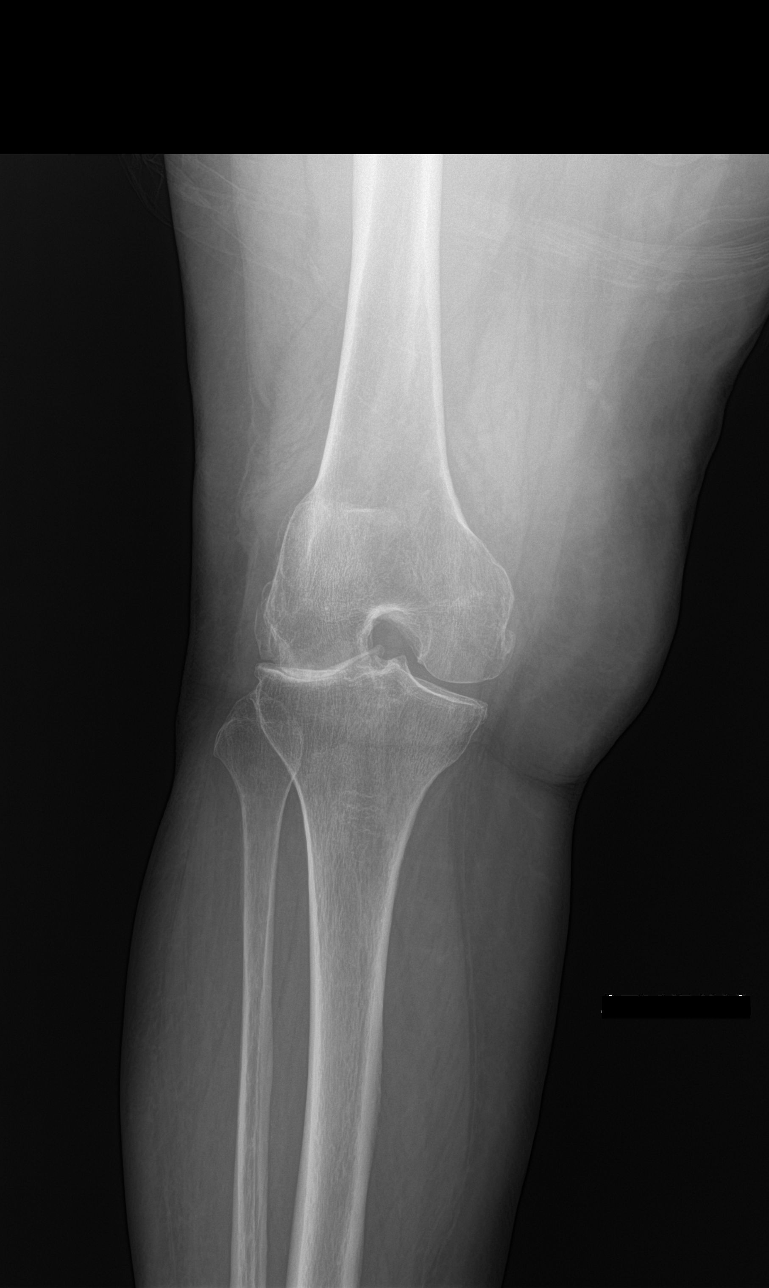

[5 of 5 positions shown; findings below may reference images not displayed]

FINDINGS: No acute fracture or dislocation identified. Mild-to-moderate
tricompartmental joint space narrowing with small marginal
osteophytes. Bones appear osteopenic. No significant joint effusion
visualized.
IMPRESSION: Degenerative changes with no acute osseous abnormality identified.

## 2024-09-22 ENCOUNTER — Ambulatory Visit: Admitting: Family Medicine

## 2024-11-17 ENCOUNTER — Ambulatory Visit: Admitting: Dermatology
